# Patient Record
Sex: Male | Born: 1950
Health system: Southern US, Community
[De-identification: ages and names within clinical notes are randomized; demographics above are authoritative.]

## PROBLEM LIST (undated history)

## (undated) DIAGNOSIS — Z972 Presence of dental prosthetic device (complete) (partial): Secondary | ICD-10-CM

## (undated) DIAGNOSIS — G4730 Sleep apnea, unspecified: Secondary | ICD-10-CM

## (undated) DIAGNOSIS — E669 Obesity, unspecified: Secondary | ICD-10-CM

## (undated) DIAGNOSIS — M51369 Other intervertebral disc degeneration, lumbar region without mention of lumbar back pain or lower extremity pain: Secondary | ICD-10-CM

## (undated) DIAGNOSIS — R7301 Impaired fasting glucose: Secondary | ICD-10-CM

## (undated) DIAGNOSIS — E119 Type 2 diabetes mellitus without complications: Secondary | ICD-10-CM

## (undated) DIAGNOSIS — K08109 Complete loss of teeth, unspecified cause, unspecified class: Secondary | ICD-10-CM

## (undated) DIAGNOSIS — M5136 Other intervertebral disc degeneration, lumbar region: Secondary | ICD-10-CM

## (undated) DIAGNOSIS — N400 Enlarged prostate without lower urinary tract symptoms: Secondary | ICD-10-CM

## (undated) DIAGNOSIS — I10 Essential (primary) hypertension: Secondary | ICD-10-CM

## (undated) DIAGNOSIS — M1990 Unspecified osteoarthritis, unspecified site: Secondary | ICD-10-CM

## (undated) DIAGNOSIS — M5126 Other intervertebral disc displacement, lumbar region: Secondary | ICD-10-CM

## (undated) DIAGNOSIS — R972 Elevated prostate specific antigen [PSA]: Secondary | ICD-10-CM

## (undated) DIAGNOSIS — E785 Hyperlipidemia, unspecified: Secondary | ICD-10-CM

## (undated) HISTORY — PX: PROSTATE BIOPSY: SHX241

## (undated) HISTORY — DX: Complete loss of teeth, unspecified cause, unspecified class: Z97.2

## (undated) HISTORY — DX: Other intervertebral disc degeneration, lumbar region without mention of lumbar back pain or lower extremity pain: M51.369

## (undated) HISTORY — DX: Other intervertebral disc displacement, lumbar region: M51.26

## (undated) HISTORY — DX: Sleep apnea, unspecified: G47.30

## (undated) HISTORY — DX: Hyperlipidemia, unspecified: E78.5

## (undated) HISTORY — DX: Elevated prostate specific antigen (PSA): R97.20

## (undated) HISTORY — DX: Unspecified osteoarthritis, unspecified site: M19.90

## (undated) HISTORY — DX: Obesity, unspecified: E66.9

## (undated) HISTORY — DX: Benign prostatic hyperplasia without lower urinary tract symptoms: N40.0

## (undated) HISTORY — DX: Complete loss of teeth, unspecified cause, unspecified class: K08.109

## (undated) HISTORY — DX: Impaired fasting glucose: R73.01

## (undated) HISTORY — DX: Type 2 diabetes mellitus without complications: E11.9

## (undated) HISTORY — DX: Other intervertebral disc degeneration, lumbar region: M51.36

---

## 1993-03-05 HISTORY — PX: INGUINAL HERNIA REPAIR: SUR1180

## 2010-04-05 ENCOUNTER — Other Ambulatory Visit (HOSPITAL_COMMUNITY): Payer: Self-pay | Admitting: Chiropractic Medicine

## 2010-04-05 DIAGNOSIS — M545 Low back pain, unspecified: Secondary | ICD-10-CM

## 2010-04-13 ENCOUNTER — Ambulatory Visit (HOSPITAL_COMMUNITY)
Admission: RE | Admit: 2010-04-13 | Discharge: 2010-04-13 | Disposition: A | Discharge status: Home / Self Care | Payer: No Typology Code available for payment source | Source: Ambulatory Visit | Attending: Chiropractic Medicine | Admitting: Chiropractic Medicine

## 2010-04-13 ENCOUNTER — Other Ambulatory Visit (HOSPITAL_COMMUNITY): Payer: Self-pay | Admitting: Chiropractic Medicine

## 2010-04-13 DIAGNOSIS — M545 Low back pain, unspecified: Secondary | ICD-10-CM

## 2010-04-13 DIAGNOSIS — M48061 Spinal stenosis, lumbar region without neurogenic claudication: Secondary | ICD-10-CM | POA: Insufficient documentation

## 2010-04-13 DIAGNOSIS — M5137 Other intervertebral disc degeneration, lumbosacral region: Secondary | ICD-10-CM | POA: Insufficient documentation

## 2010-04-13 DIAGNOSIS — X58XXXA Exposure to other specified factors, initial encounter: Secondary | ICD-10-CM

## 2010-04-13 DIAGNOSIS — M51379 Other intervertebral disc degeneration, lumbosacral region without mention of lumbar back pain or lower extremity pain: Secondary | ICD-10-CM | POA: Insufficient documentation

## 2010-04-13 DIAGNOSIS — M519 Unspecified thoracic, thoracolumbar and lumbosacral intervertebral disc disorder: Secondary | ICD-10-CM | POA: Insufficient documentation

## 2010-04-13 DIAGNOSIS — Z01818 Encounter for other preprocedural examination: Secondary | ICD-10-CM | POA: Insufficient documentation

## 2010-04-13 DIAGNOSIS — M47817 Spondylosis without myelopathy or radiculopathy, lumbosacral region: Secondary | ICD-10-CM | POA: Insufficient documentation

## 2011-08-20 ENCOUNTER — Emergency Department (HOSPITAL_COMMUNITY)
Admission: EM | Admit: 2011-08-20 | Discharge: 2011-08-21 | Disposition: A | Discharge status: Home / Self Care | Payer: Self-pay | Attending: Emergency Medicine | Admitting: Emergency Medicine

## 2011-08-20 ENCOUNTER — Encounter (HOSPITAL_COMMUNITY): Payer: Self-pay | Admitting: Emergency Medicine

## 2011-08-20 DIAGNOSIS — N429 Disorder of prostate, unspecified: Secondary | ICD-10-CM | POA: Insufficient documentation

## 2011-08-20 DIAGNOSIS — Z79899 Other long term (current) drug therapy: Secondary | ICD-10-CM | POA: Insufficient documentation

## 2011-08-20 DIAGNOSIS — I10 Essential (primary) hypertension: Secondary | ICD-10-CM | POA: Insufficient documentation

## 2011-08-20 DIAGNOSIS — R509 Fever, unspecified: Secondary | ICD-10-CM | POA: Insufficient documentation

## 2011-08-20 DIAGNOSIS — R7989 Other specified abnormal findings of blood chemistry: Secondary | ICD-10-CM | POA: Insufficient documentation

## 2011-08-20 HISTORY — DX: Essential (primary) hypertension: I10

## 2011-08-20 NOTE — ED Notes (Signed)
Pt reports fevers for last 8 days; went to see MD b/c he has hx of prostate infection but MD cleared him; went to urgent care yesterday and had workup and everything clean except for liver enzymes elevated. Pt told that if he did not feel better today then to come to ED for further evaluation.

## 2011-08-21 LAB — CBC
HCT: 41.4 % (ref 39.0–52.0)
Hemoglobin: 14.3 g/dL (ref 13.0–17.0)
MCH: 29.6 pg (ref 26.0–34.0)
MCV: 85.7 fL (ref 78.0–100.0)
RBC: 4.83 MIL/uL (ref 4.22–5.81)

## 2011-08-21 LAB — DIFFERENTIAL
Eosinophils Absolute: 0 10*3/uL (ref 0.0–0.7)
Eosinophils Relative: 0 % (ref 0–5)
Lymphs Abs: 1.7 10*3/uL (ref 0.7–4.0)
Monocytes Relative: 8 % (ref 3–12)
Neutrophils Relative %: 59 % (ref 43–77)

## 2011-08-21 LAB — URINALYSIS, ROUTINE W REFLEX MICROSCOPIC
Ketones, ur: NEGATIVE mg/dL
Nitrite: NEGATIVE
Protein, ur: NEGATIVE mg/dL
Urobilinogen, UA: 1 mg/dL (ref 0.0–1.0)
pH: 6.5 (ref 5.0–8.0)

## 2011-08-21 LAB — COMPREHENSIVE METABOLIC PANEL
Alkaline Phosphatase: 145 U/L — ABNORMAL HIGH (ref 39–117)
BUN: 8 mg/dL (ref 6–23)
GFR calc Af Amer: >90 mL/min (ref >90)
Glucose, Bld: 115 mg/dL — ABNORMAL HIGH (ref 70–99)
Potassium: 4.4 mEq/L (ref 3.5–5.1)
Total Bilirubin: 0.5 mg/dL (ref 0.3–1.2)
Total Protein: 7.3 g/dL (ref 6.0–8.3)

## 2011-08-21 LAB — URINE CULTURE

## 2011-08-21 LAB — URINE MICROSCOPIC-ADD ON

## 2011-08-21 MED ORDER — DOXYCYCLINE HYCLATE 100 MG PO CAPS: 100.0000 mg | ORAL_CAPSULE | Freq: Two times a day (BID) | ORAL | Status: AC

## 2011-08-21 MED ORDER — DOXYCYCLINE HYCLATE 100 MG PO TABS
100.0000 mg | ORAL_TABLET | Freq: Once | ORAL | Status: AC
  Administered 2011-08-21: 100 mg via ORAL
  Filled 2011-08-21: qty 1

## 2011-08-21 NOTE — ED Provider Notes (Signed)
History     CSN: [id number]  Arrival date & time 08/20/11  4098   First MD Initiated Contact with Patient 08/20/11 2304      Chief Complaint  Patient presents with  . Fever    (Consider location/radiation/quality/duration/timing/severity/associated sxs/prior treatment) Patient is a 61 y.o. male presenting with fever.  Fever Primary symptoms of the febrile illness include fever.   61 year old male presents to emergency room complaining of 8 days of intermittent fevers. Patient reports fevers have gone to 101. When the Deaver is present he has headaches. Patient denies any other symptoms associated with the fevers. He denies any tick bites or other insect bites. He has had no rashes. He denies any nausea vomiting or diarrhea. No abdominal pain no cough. Today he has had some slight head congestion. Patient reports history of prostatitis in the past, but denies any problems with urination, or prostate type pain. No sick contacts, no travel, no camping, no prior history of similar fevers. patient denies any neck stiffness or pain. Patient was seen by his urologist on Friday, and had a reportedly negative urine. Patient was seen at urgent care on Saturday, and had negative chest x-ray and blood work negative other than elevated LFTs. He was told to stop taking Tylenol. Patient denies previous history of liver problems. No new medications Past Medical History  Diagnosis Date  . Hypertension   . Prostate disease     No past surgical history on file.  No family history on file.  History  Substance Use Topics  . Smoking status: Not on file  . Smokeless tobacco: Not on file  . Alcohol Use:       Review of Systems  Constitutional: Positive for fever.  All other systems reviewed and are negative.    Allergies  Morphine and related  Home Medications   Current Outpatient Rx  Name Route Sig Dispense Refill  . LISINOPRIL-HYDROCHLOROTHIAZIDE 20-12.5 MG PO TABS Oral Take 1 tablet by  mouth daily.    Marland Kitchen TAMSULOSIN HCL 0.4 MG PO CAPS Oral Take 0.4 mg by mouth.      BP 139/91  Pulse 65  Temp 98.3 F (36.8 C) (Oral)  Resp 18  SpO2 97%  Physical Exam  Nursing note and vitals reviewed. Constitutional: He is oriented to person, place, and time. He appears well-developed and well-nourished.  HENT:  Head: Normocephalic and atraumatic.  Right Ear: External ear normal.  Left Ear: External ear normal.  Nose: Nose normal.  Mouth/Throat: Oropharynx is clear and moist.  Eyes: Conjunctivae and EOM are normal. Pupils are equal, round, and reactive to light.  Neck: Normal range of motion. Neck supple. No JVD present. No tracheal deviation present. No thyromegaly present.  Cardiovascular: Normal rate, regular rhythm, normal heart sounds and intact distal pulses.  Exam reveals no gallop and no friction rub.   No murmur heard. Pulmonary/Chest: Effort normal and breath sounds normal. No stridor. No respiratory distress. He has no wheezes. He has no rales. He exhibits no tenderness.  Abdominal: Soft. Bowel sounds are normal. He exhibits no distension and no mass. There is no tenderness. There is no rebound and no guarding.  Genitourinary: Rectum normal and prostate normal.  Musculoskeletal: Normal range of motion. He exhibits no edema and no tenderness.  Lymphadenopathy:    He has no cervical adenopathy.  Neurological: He is oriented to person, place, and time. He has normal reflexes. No cranial nerve deficit. He exhibits normal muscle tone. Coordination normal.  Skin: Skin  is dry. No rash noted. No erythema. No pallor.  Psychiatric: He has a normal mood and affect. His behavior is normal. Judgment and thought content normal.    ED Course  Procedures (including critical care time)  Labs Reviewed  COMPREHENSIVE METABOLIC PANEL - Abnormal; Notable for the following:    Glucose, Bld 115 (*)     AST 119 (*)     ALT 124 (*)     Alkaline Phosphatase 145 (*)     All other components  within normal limits  URINALYSIS, ROUTINE W REFLEX MICROSCOPIC - Abnormal; Notable for the following:    Leukocytes, UA TRACE (*)     All other components within normal limits  CBC  DIFFERENTIAL  URINE MICROSCOPIC-ADD ON  CULTURE, BLOOD (ROUTINE X 2)  CULTURE, BLOOD (ROUTINE X 2)  URINE CULTURE  HEPATITIS PANEL, ACUTE  ROCKY MTN SPOTTED FVR AB, IGG-BLOOD  ROCKY MTN SPOTTED FVR AB, IGM-BLOOD  LYME DISEASE DNA BY PCR(BORRELIA BURG)   No results found.   1. Fever of unknown origin       MDM  61 year old with 8 days of fever and is otherwise unexplained. He does have elevated LFTs, has not remember any tick bites. Will draw titers for Lyme and Rocky spotted mountain fever, as well as hepatitis labs. Will start on doxycycline for possible tickborne illness. Will have him followup with [facility]3 days for recheck of labs and symptoms. Patient may need followup with infectious disease if fevers continue       Olivia Mackie, MD 08/21/11 [phone]

## 2011-08-21 NOTE — Discharge Instructions (Signed)
At this time it is unclear what is the cause of your fever. You had labs today checking for possible hepatitis and tickborne diseases. You also had blood cultures done. Please followup with your doctor in 2-3 days for followup of these labs. Continue taking ibuprofen or Aleve for fevers. Start doxycycline as prescribed. This antibiotic is helpful with tickborne diseases. Return to the emergency department for worsening condition or new concerning symptoms.  Fever of Unknown Origin Fever of "unknown origin" is a fever of at least 101 F (38.3 C) or greater, and that has gone on daily for three weeks. It is a fever which has a hidden cause. Fever is a higher-than-normal body temperature. Normal temperature is usually defined as 98.6 F or 37 C. Fever is a symptom, not a disease. A fever may mean that there is something else going on in the body that is causing it. CAUSES Fever can be caused by many conditions, including:   Infections.   Tissue injuries.   Medicines.   Different diseases.   Being in hot surroundings.   Tumors or cancers (this is a rare cause).  SYMPTOMS The signs and symptoms of a fever depend on the cause. At first, a fever can cause a chill. When the brain raises the body's "thermostat," the body responds by shivering to raise the temperature. Shivering produces heat in the body. Once the temperature goes up, the person often feels warm. When the fever goes away, the person may start to sweat. DIAGNOSIS  There can be many causes of fever. Sometimes, the reason can be very difficult to find. Your caregiver may have to do numerous tests to track down the reason. TREATMENT   Medication may be used to control fever.   Do not use aspirin because of the association with Reye's syndrome.   If an infection is suspected to be causing the fever and medications have been prescribed, take them as directed. Finish the full course of medications until they are gone.   Sponging or  bathing in lukewarm water can cool the skin and reduce body temperature. Ice water or alcohol sponge baths are not as effective as lukewarm water and should not be used.  HOME CARE  Continue to eat normally.   Drink enough fluids to keep urine clear or pale yellow.   Broths, decaffeinated tea, decaffeinated soft drinks, and oral rehydration solutions (ORS) can help replace fluids and electrolytes.   Keep all follow-up appointments as directed by your caregiver.   Weigh yourself once a day. Write down the weights and bring them to your follow-up appointments to review with your caregiver.  SEEK IMMEDIATE MEDICAL CARE IF:   You or your child is unable to keep fluids down.   Vomiting or diarrhea develop or are present and become persistent (continued).   There is excessive weakness, dizziness, fainting or extreme thirst.   You have a fever or persistent symptoms for more than 72 hours.   You have a fever and your symptoms suddenly get worse.  Document Released: 01/06/2004 Document Revised: 02/08/2011 Document Reviewed: 02/19/2005 [facility] Patient Information 2012 [place], [place].

## 2011-08-22 LAB — HEPATITIS PANEL, ACUTE
Hep A IgM: NEGATIVE
Hep B C IgM: NEGATIVE
Hepatitis B Surface Ag: NEGATIVE

## 2011-08-27 LAB — CULTURE, BLOOD (ROUTINE X 2)
Culture  Setup Time: 201306180343
Culture: NO GROWTH

## 2012-03-05 HISTORY — PX: PROSTATE BIOPSY: SHX241

## 2013-03-05 HISTORY — PX: COLONOSCOPY: SHX174

## 2013-10-16 ENCOUNTER — Encounter: Payer: Self-pay | Admitting: Gastroenterology

## 2013-12-11 ENCOUNTER — Ambulatory Visit (AMBULATORY_SURGERY_CENTER): Payer: BC Managed Care – PPO | Admitting: *Deleted

## 2013-12-11 VITALS — Ht 67.0 in | Wt 218.0 lb

## 2013-12-11 DIAGNOSIS — Z1211 Encounter for screening for malignant neoplasm of colon: Secondary | ICD-10-CM

## 2013-12-11 NOTE — Progress Notes (Signed)
No allergies to eggs or soy. No problems with anesthesia.  Pt given Emmi instructions for colonoscopy  No oxygen use  No diet drug use  

## 2013-12-15 ENCOUNTER — Encounter: Payer: Self-pay | Admitting: Internal Medicine

## 2013-12-15 ENCOUNTER — Ambulatory Visit (AMBULATORY_SURGERY_CENTER): Payer: BC Managed Care – PPO | Admitting: Internal Medicine

## 2013-12-15 VITALS — BP 126/71 | HR 53 | Temp 97.4°F | Resp 17 | Ht 67.0 in | Wt 218.0 lb

## 2013-12-15 DIAGNOSIS — Z1211 Encounter for screening for malignant neoplasm of colon: Secondary | ICD-10-CM

## 2013-12-15 MED ORDER — SODIUM CHLORIDE 0.9 % IV SOLN: 500.0000 mL | INTRAVENOUS | Status: DC

## 2013-12-15 NOTE — Progress Notes (Signed)
A/ox3 pleased with MAC, report to Wendy RN 

## 2013-12-15 NOTE — Op Note (Signed)
[facility] [address] [place], [id number]   COLONOSCOPY PROCEDURE REPORT  PATIENT: Luke Orr, Luke Orr  MR#: [id number] BIRTHDATE: [birth year] , 62  yrs. old GENDER: male ENDOSCOPIST: Iva Booparl E Shayda Kalka, MD, [facility] PROCEDURE DATE:  12/15/2013 PROCEDURE:   Colonoscopy, screening First Screening Colonoscopy - Avg.  risk and is 50 yrs.  old or older - No.  Prior Negative Screening - Now for repeat screening. 10 or more years since last screening  History of Adenoma - Now for follow-up colonoscopy & has been > or = to 3 yrs.  N/A  Polyps Removed Today? No.  Polyps Removed Today? No.  Recommend repeat exam, <10 yrs? Polyps Removed Today? No.  Recommend repeat exam, <10 yrs? No. ASA CLASS:   Class II INDICATIONS:average risk for colorectal cancer and last colonoscopy completed  11 years ago. MEDICATIONS: Propofol 200 mg IV and Monitored anesthesia care  DESCRIPTION OF PROCEDURE:   After the risks benefits and alternatives of the procedure were thoroughly explained, informed consent was obtained.  The digital rectal exam revealed no abnormalities of the rectum, revealed no prostatic nodules, and revealed the prostate was not enlarged.   The LB [id number] endoscope was introduced through the anus and advanced to the cecum, which was identified by both the appendix and ileocecal valve. No adverse events experienced.   The quality of the prep was excellent, using MiraLax  The instrument was then slowly withdrawn as the colon was fully examined.      COLON FINDINGS: There was mild diverticulosis noted in the sigmoid colon and ascending colon.   The examination was otherwise normal. Retroflexed views revealed no abnormalities. The time to cecum=3 minutes 33 seconds.  Withdrawal time=9 minutes 52 seconds.  The scope was withdrawn and the procedure completed. COMPLICATIONS: There were no immediate complications.  ENDOSCOPIC IMPRESSION: 1.   Mild diverticulosis was  noted in the sigmoid colon and ascending colon 2.   The examination was otherwise normal  RECOMMENDATIONS: Repeat colonoscopy 10 years.  eSigned:  Iva Booparl E Brynlee Pennywell, MD, [facility] 12/15/2013 2:31 PM   cc: The Patient and Gabriel Earingavis Cloward, MD

## 2013-12-15 NOTE — Patient Instructions (Addendum)
No polyps today!  You do have a condition called diverticulosis - common and not usually a problem. Please read the handout provided.  Next routine colonoscopy in 10 years - 2025  I appreciate the opportunity to care for you. Iva Booparl E. Gessner, MD, FACG  YOU HAD AN ENDOSCOPIC PROCEDURE TODAY AT THE [facility]: Refer to the procedure report that was given to you for any specific questions about what was found during the examination.  If the procedure report does not answer your questions, please call your gastroenterologist to clarify.  If you requested that your care partner not be given the details of your procedure findings, then the procedure report has been included in a sealed envelope for you to review at your convenience later.  YOU SHOULD EXPECT: Some feelings of bloating in the abdomen. Passage of more gas than usual.  Walking can help get rid of the air that was put into your GI tract during the procedure and reduce the bloating. If you had a lower endoscopy (such as a colonoscopy or flexible sigmoidoscopy) you may notice spotting of blood in your stool or on the toilet paper. If you underwent a bowel prep for your procedure, then you may not have a normal bowel movement for a few days.  DIET: Your first meal following the procedure should be a light meal and then it is ok to progress to your normal diet.  A half-sandwich or bowl of soup is an example of a good first meal.  Heavy or fried foods are harder to digest and may make you feel nauseous or bloated.  Likewise meals heavy in dairy and vegetables can cause extra gas to form and this can also increase the bloating.  Drink plenty of fluids but you should avoid alcoholic beverages for 24 hours.  ACTIVITY: Your care partner should take you home directly after the procedure.  You should plan to take it easy, moving slowly for the rest of the day.  You can resume normal activity the day after the procedure however you  should NOT DRIVE or use heavy machinery for 24 hours (because of the sedation medicines used during the test).    SYMPTOMS TO REPORT IMMEDIATELY: A gastroenterologist can be reached at any hour.  During normal business hours, 8:30 AM to 5:00 PM Monday through Friday, call [phone].  After hours and on weekends, please call the GI answering service at [phone] who will take a message and have the physician on call contact you.   Following lower endoscopy (colonoscopy or flexible sigmoidoscopy):  Excessive amounts of blood in the stool  Significant tenderness or worsening of abdominal pains  Swelling of the abdomen that is new, acute  Fever of 100F or higher  FOLLOW UP: If any biopsies were taken you will be contacted by phone or by letter within the next 1-3 weeks.  Call your gastroenterologist if you have not heard about the biopsies in 3 weeks.  Our staff will call the home number listed on your records the next business day following your procedure to check on you and address any questions or concerns that you may have at that time regarding the information given to you following your procedure. This is a courtesy call and so if there is no answer at the home number and we have not heard from you through the emergency physician on call, we will assume that you have returned to your regular daily activities without incident.  SIGNATURES/CONFIDENTIALITY: You and/or your care partner have signed paperwork which will be entered into your electronic medical record.  These signatures attest to the fact that that the information above on your After Visit Summary has been reviewed and is understood.  Full responsibility of the confidentiality of this discharge information lies with you and/or your care-partner.  Recommendations Next routine colonoscopy in 10 years. Diverticulosis handout provided to patient/caregiver.

## 2013-12-16 ENCOUNTER — Telehealth: Payer: Self-pay | Admitting: *Deleted

## 2013-12-16 NOTE — Telephone Encounter (Signed)
  Follow up Call-  Call back number 12/15/2013  Post procedure Call Back phone  # [phone]  Permission to leave phone message Yes     Patient questions:  Do you have a fever, pain , or abdominal swelling? No. Pain Score  0 *  Have you tolerated food without any problems? Yes.    Have you been able to return to your normal activities? Yes.    Do you have any questions about your discharge instructions: Diet   No. Medications  No. Follow up visit  No.  Do you have questions or concerns about your Care? No.  Actions: * If pain score is 4 or above: No action needed, pain <4.

## 2013-12-22 ENCOUNTER — Encounter: Payer: Self-pay | Admitting: Gastroenterology

## 2013-12-25 ENCOUNTER — Encounter: Payer: Self-pay | Admitting: Cardiovascular Disease

## 2013-12-25 ENCOUNTER — Ambulatory Visit (INDEPENDENT_AMBULATORY_CARE_PROVIDER_SITE_OTHER): Payer: BC Managed Care – PPO | Admitting: Cardiovascular Disease

## 2013-12-25 ENCOUNTER — Telehealth: Payer: Self-pay | Admitting: *Deleted

## 2013-12-25 ENCOUNTER — Encounter (HOSPITAL_COMMUNITY): Payer: Self-pay | Admitting: *Deleted

## 2013-12-25 VITALS — BP 141/87 | HR 57 | Ht 66.0 in | Wt 215.9 lb

## 2013-12-25 DIAGNOSIS — E785 Hyperlipidemia, unspecified: Secondary | ICD-10-CM | POA: Insufficient documentation

## 2013-12-25 DIAGNOSIS — I10 Essential (primary) hypertension: Secondary | ICD-10-CM | POA: Insufficient documentation

## 2013-12-25 DIAGNOSIS — M79604 Pain in right leg: Secondary | ICD-10-CM

## 2013-12-25 DIAGNOSIS — I739 Peripheral vascular disease, unspecified: Secondary | ICD-10-CM

## 2013-12-25 NOTE — Assessment & Plan Note (Signed)
On statin therapy followed by his PCP 

## 2013-12-25 NOTE — Assessment & Plan Note (Signed)
Controlled on current medications 

## 2013-12-25 NOTE — Patient Instructions (Signed)
  We will see you back in follow up only as needed.   Dr Berry has ordered: lower extremity arterial doppler- During this test, ultrasound is used to evaluate arterial blood flow in the legs. Allow approximately one hour for this exam.      

## 2013-12-25 NOTE — Assessment & Plan Note (Signed)
The patient is a 63 year old gentleman with positive risk factors referred for right lower extremity pain. The pain is primarily in his knee and below. It's worse when he's sitting and lying down, better when he ambulates. He does have intact pedal pulses. His primary care physician thought that he had atherosclerotic changes and he was referred here for further evaluation. Likely this appears arthritic, he does have positive risk factors for vascular disease. I'm going to get lower extremity arterial Doppler studies these are normal, I will see him back when necessary.

## 2013-12-25 NOTE — Progress Notes (Signed)
12/25/2013 Luke Orr   1950/03/14  [id number]  Primary Physician No PCP Per Patient Primary Cardiologist: Runell GessJonathan J. Kamrin Spath MD Roseanne RenoFACP,FACC,FAHA, FSCAI   HPI:  [name] is a delightful 63 year old moderately overweight married African American male father of 3, grandfather of 4 grandchildren [facility] [address] for evaluation of PAD. 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 His risk factors for heart disease include a 35 pack years of tobacco abuse having quit 10 years ago. History of hypertension, hyperlipidemia as well as family history of heart disease. His father died of a myocardial infarction at age 334 and he has a brother who had stents. He has never had a heart attack or stroke and specifically denies chest pain or shortness of breath. He does get pain in his right knee and pretibial area that improves with ambulation and worse when sitting or lying down. His referring physician thought that there was a vascular component.   Current Outpatient Prescriptions  Medication Sig Dispense Refill  . aspirin 81 MG chewable tablet Chew 81 mg by mouth.      Marland Kitchen. atorvastatin (LIPITOR) 40 MG tablet TAKE 1 TABLET (40 MG TOTAL) BY MOUTH DAILY.      Marland Kitchen. lisinopril-hydrochlorothiazide (PRINZIDE,ZESTORETIC) 20-12.5 MG per tablet Take 1 tablet by mouth daily.       No current facility-administered medications for this visit.    Allergies  Allergen Reactions  . Morphine And Related Shortness Of Breath    History   Social History  . Marital Status: Unknown    Spouse Name: N/A    Number of Children: N/A  . Years of Education: N/A   Occupational History  . Not on file.   Social History Main Topics  . Smoking status: Former Smoker    Quit date: 03/06/2003  . Smokeless tobacco: Never Used  . Alcohol Use: 4.2 oz/week    7 Glasses of wine per week  . Drug Use: No  . Sexual Activity: Not on file   Other Topics Concern  . Not on file   Social History Narrative   . No narrative on file     Review of Systems: General: negative for chills, fever, night sweats or weight changes.  Cardiovascular: negative for chest pain, dyspnea on exertion, edema, orthopnea, palpitations, paroxysmal nocturnal dyspnea or shortness of breath Dermatological: negative for rash Respiratory: negative for cough or wheezing Urologic: negative for hematuria Abdominal: negative for nausea, vomiting, diarrhea, bright red blood per rectum, melena, or hematemesis Neurologic: negative for visual changes, syncope, or dizziness All other systems reviewed and are otherwise negative except as noted above.    Blood pressure 141/87, pulse 57, height 5\' 6"  (1.676 m), weight 215 lb 14.4 oz (97.932 kg).  General appearance: alert and no distress Neck: no adenopathy, no carotid bruit, no JVD, supple, symmetrical, trachea midline and thyroid not enlarged, symmetric, no tenderness/mass/nodules Lungs: clear to auscultation bilaterally Heart: regular rate and rhythm, S1, S2 normal, no murmur, click, rub or gallop Extremities: extremities normal, atraumatic, no cyanosis or edema and 2+ pedal pulses bilaterally  EKG sinus bradycardia of 57 without ST or T wave changes  ASSESSMENT AND PLAN:   Essential hypertension Controlled on current medications  Hyperlipidemia On statin therapy followed by his PCP  Lower extremity pain, right The patient is a 63 year old gentleman with positive risk factors referred for right lower extremity pain. The pain is primarily in his knee and below. It's worse when he's  sitting and lying down, better when he ambulates. He does have intact pedal pulses. His primary care physician thought that he had atherosclerotic changes and he was referred here for further evaluation. Likely this appears arthritic, he does have positive risk factors for vascular disease. I'm going to get lower extremity arterial Doppler studies these are normal, I will see him back when  necessary.      Runell GessJonathan J. Noelani Harbach MD FACP,FACC,FAHA, [facility] 12/25/2013 9:18 AM

## 2014-01-05 ENCOUNTER — Ambulatory Visit (HOSPITAL_COMMUNITY)
Admission: RE | Admit: 2014-01-05 | Discharge: 2014-01-05 | Disposition: A | Discharge status: Home / Self Care | Payer: BC Managed Care – PPO | Source: Ambulatory Visit | Attending: Cardiovascular Disease | Admitting: Cardiovascular Disease

## 2014-01-05 DIAGNOSIS — I739 Peripheral vascular disease, unspecified: Secondary | ICD-10-CM

## 2014-01-05 NOTE — Progress Notes (Signed)
Lower Extremity Arterial Duplex Completed. °Brianna L Mazza,RVT °

## 2014-01-07 NOTE — Telephone Encounter (Signed)
No answer, message left to call with concerns

## 2014-01-14 ENCOUNTER — Encounter: Payer: Self-pay | Admitting: *Deleted

## 2014-09-13 ENCOUNTER — Encounter (HOSPITAL_COMMUNITY): Payer: Self-pay | Admitting: Nurse Practitioner

## 2014-09-13 ENCOUNTER — Emergency Department (HOSPITAL_COMMUNITY)
Admission: EM | Admit: 2014-09-13 | Discharge: 2014-09-13 | Disposition: A | Discharge status: Home / Self Care | Payer: 59 | Attending: Emergency Medicine | Admitting: Emergency Medicine

## 2014-09-13 DIAGNOSIS — I10 Essential (primary) hypertension: Secondary | ICD-10-CM | POA: Diagnosis not present

## 2014-09-13 DIAGNOSIS — E785 Hyperlipidemia, unspecified: Secondary | ICD-10-CM | POA: Insufficient documentation

## 2014-09-13 DIAGNOSIS — M1990 Unspecified osteoarthritis, unspecified site: Secondary | ICD-10-CM | POA: Insufficient documentation

## 2014-09-13 DIAGNOSIS — Z79899 Other long term (current) drug therapy: Secondary | ICD-10-CM | POA: Insufficient documentation

## 2014-09-13 DIAGNOSIS — Z7982 Long term (current) use of aspirin: Secondary | ICD-10-CM | POA: Insufficient documentation

## 2014-09-13 DIAGNOSIS — Z87891 Personal history of nicotine dependence: Secondary | ICD-10-CM | POA: Insufficient documentation

## 2014-09-13 NOTE — ED Notes (Signed)
Pt reports head congestion and not feeling well since Thursday, hes been checking his BP and its been elevated. He increased his BP medication to twice daily on his own but his symptoms persist. A&Ox4, rsep e/u. Denies cp/sob

## 2014-09-13 NOTE — ED Provider Notes (Signed)
CSN: [id number]     Arrival date & time 09/13/14  1742 History   First MD Initiated Contact with Patient 09/13/14 1937     Chief Complaint  Patient presents with  . Hypertension     (Consider location/radiation/quality/duration/timing/severity/associated sxs/prior Treatment) HPI Comments: Patient presents today with a chief complaint of hypertension.  He reports that he checked his blood pressure four days ago and it was 190/110.  He states that he has continued to check it daily since that time and it has been around 190/110 each time. He has a history of HTN and is currently on Lisinopril 20 mg-HCTZ 12.5 mg daily.  He states that over the past 3 days he has been taking the medication in the morning and in the evening.  He reports his last dose of blood pressure medications was this morning.  He reports some associated nasal congestion, post nasal drip, and sinus pressure over the past 3 days, but states that he has not been taking any decongestants or other medications.  He denies headache, vision changes, numbness, tingling, chest pain, SOB, nausea, vomiting, focal weakness, ataxia, difficulty speaking, difficulty swallowing, or facial droop.  He reports that he has an appointment with PCP in two days.  Patient is a 64 y.o. male presenting with hypertension. The history is provided by the patient.  Hypertension    Past Medical History  Diagnosis Date  . Hypertension   . Prostate disease   . Hyperlipidemia   . Arthritis   . Atherosclerosis of autologous vein bypass graft of right lower extremity with rest pain    Past Surgical History  Procedure Laterality Date  . Inguinal hernia repair Right 1995  . Colonoscopy     Family History  Problem Relation Age of Onset  . Colon cancer Neg Hx    History  Substance Use Topics  . Smoking status: Former Smoker    Quit date: 03/06/2003  . Smokeless tobacco: Never Used  . Alcohol Use: 4.2 oz/week    7 Glasses of wine per week    Review  of Systems  All other systems reviewed and are negative.     Allergies  Morphine and related  Home Medications   Prior to Admission medications   Medication Sig Start Date End Date Taking? Authorizing Provider  aspirin 81 MG chewable tablet Chew 81 mg by mouth.    Historical Provider, MD  atorvastatin (LIPITOR) 40 MG tablet TAKE 1 TABLET (40 MG TOTAL) BY MOUTH DAILY. 10/12/13   Historical Provider, MD  lisinopril-hydrochlorothiazide (PRINZIDE,ZESTORETIC) 20-12.5 MG per tablet Take 1 tablet by mouth daily.    Historical Provider, MD   BP 148/93 mmHg  Pulse 66  Temp(Src) 98.8 F (37.1 C) (Oral)  Resp 16  Ht  (1.702 m)  Wt 223 lb 8 oz (101.379 kg)  BMI 35.00 kg/m2  SpO2 98% Physical Exam  Constitutional: He appears well-developed and well-nourished.  HENT:  Head: Normocephalic and atraumatic.  Mouth/Throat: Oropharynx is clear and moist.  Eyes: EOM are normal. Pupils are equal, round, and reactive to light.  Neck: Normal range of motion. Neck supple.  Cardiovascular: Normal rate, regular rhythm and normal heart sounds.   Pulmonary/Chest: Effort normal and breath sounds normal.  Musculoskeletal: Normal range of motion.  Neurological: He is alert. He has normal strength. No cranial nerve deficit or sensory deficit. Coordination and gait normal.  Skin: Skin is warm and dry.  Psychiatric: He has a normal mood and affect.  Nursing note and  vitals reviewed.   ED Course  Procedures (including critical care time) Labs Review Labs Reviewed - No data to display  Imaging Review No results found.   EKG Interpretation None     Today's Vitals   09/13/14 1803 09/13/14 1806 09/13/14 2007 09/13/14 2010  BP:  148/93 132/76 132/76  Pulse:  66 57 56  Temp:  98.8 F (37.1 C)    TempSrc:  Oral    Resp:  16  22  Height: 5\' 7"  (1.702 m)     Weight: 223 lb 8 oz (101.379 kg)     SpO2:  98% 97% 98%  PainSc:  0-No pain      MDM   Final diagnoses:  None   Patient presents  today with a chief complaint of hypertension.  No signs or symptoms of hypertensive emergency/urgency.  No chest pain.  Normal neurological exam.  Blood pressure 148/93 upon arrival in the ED, which improved to 132/76 without intervention.  Patient stable for discharge.  He reports that he has a follow up appointment with PCP in 2 days.  Return precautions given.    Santiago GladHeather Kahli Mayon, PA-C 09/13/14 [id number]  Gerhard Munchobert Lockwood, MD 09/14/14 Moses Manners0025

## 2014-09-13 NOTE — Discharge Instructions (Signed)
Follow up with your Primary Care Physician on Wednesday as scheduled.  Keep a log of your blood pressures to show them.   Hypertension Hypertension, commonly called high blood pressure, is when the force of blood pumping through your arteries is too strong. Your arteries are the blood vessels that carry blood from your heart throughout your body. A blood pressure reading consists of a higher number over a lower number, such as 110/72. The higher number (systolic) is the pressure inside your arteries when your heart pumps. The lower number (diastolic) is the pressure inside your arteries when your heart relaxes. Ideally you want your blood pressure below 120/80. Hypertension forces your heart to work harder to pump blood. Your arteries may become narrow or stiff. Having hypertension puts you at risk for heart disease, stroke, and other problems.  RISK FACTORS Some risk factors for high blood pressure are controllable. Others are not.  Risk factors you cannot control include:   Race. You may be at higher risk if you are African American.  Age. Risk increases with age.  Gender. Men are at higher risk than women before age 62 years. After age 62, women are at higher risk than men. Risk factors you can control include:  Not getting enough exercise or physical activity.  Being overweight.  Getting too much fat, sugar, calories, or salt in your diet.  Drinking too much alcohol. SIGNS AND SYMPTOMS Hypertension does not usually cause signs or symptoms. Extremely high blood pressure (hypertensive crisis) may cause headache, anxiety, shortness of breath, and nosebleed. DIAGNOSIS  To check if you have hypertension, your health care provider will measure your blood pressure while you are seated, with your arm held at the level of your heart. It should be measured at least twice using the same arm. Certain conditions can cause a difference in blood pressure between your right and left arms. A blood  pressure reading that is higher than normal on one occasion does not mean that you need treatment. If one blood pressure reading is high, ask your health care provider about having it checked again. TREATMENT  Treating high blood pressure includes making lifestyle changes and possibly taking medicine. Living a healthy lifestyle can help lower high blood pressure. You may need to change some of your habits. Lifestyle changes may include:  Following the DASH diet. This diet is high in fruits, vegetables, and whole grains. It is low in salt, red meat, and added sugars.  Getting at least 2 hours of brisk physical activity every week.  Losing weight if necessary.  Not smoking.  Limiting alcoholic beverages.  Learning ways to reduce stress. If lifestyle changes are not enough to get your blood pressure under control, your health care provider may prescribe medicine. You may need to take more than one. Work closely with your health care provider to understand the risks and benefits. HOME CARE INSTRUCTIONS  Have your blood pressure rechecked as directed by your health care provider.   Take medicines only as directed by your health care provider. Follow the directions carefully. Blood pressure medicines must be taken as prescribed. The medicine does not work as well when you skip doses. Skipping doses also puts you at risk for problems.   Do not smoke.   Monitor your blood pressure at home as directed by your health care provider. SEEK MEDICAL CARE IF:   You think you are having a reaction to medicines taken.  You have recurrent headaches or feel dizzy.  You have  swelling in your ankles.  You have trouble with your vision. SEEK IMMEDIATE MEDICAL CARE IF:  You develop a severe headache or confusion.  You have unusual weakness, numbness, or feel faint.  You have severe chest or abdominal pain.  You vomit repeatedly.  You have trouble breathing. MAKE SURE YOU:   Understand  these instructions.  Will watch your condition.  Will get help right away if you are not doing well or get worse. Document Released: 02/19/2005 Document Revised: 07/06/2013 Document Reviewed: 12/12/2012 [facility] Patient Information 2015 [place], [place]. This information is not intended to replace advice given to you by your health care provider. Make sure you discuss any questions you have with your health care provider.  DASH Eating Plan DASH stands for "Dietary Approaches to Stop Hypertension." The DASH eating plan is a healthy eating plan that has been shown to reduce high blood pressure (hypertension). Additional health benefits may include reducing the risk of type 2 diabetes mellitus, heart disease, and stroke. The DASH eating plan may also help with weight loss. WHAT DO I NEED TO KNOW ABOUT THE DASH EATING PLAN? For the DASH eating plan, you will follow these general guidelines:  Choose foods with a percent daily value for sodium of less than 5% (as listed on the food label).  Use salt-free seasonings or herbs instead of table salt or sea salt.  Check with your health care provider or pharmacist before using salt substitutes.  Eat lower-sodium products, often labeled as "lower sodium" or "no salt added."  Eat fresh foods.  Eat more vegetables, fruits, and low-fat dairy products.  Choose whole grains. Look for the word "whole" as the first word in the ingredient list.  Choose fish and skinless chicken or [place] more often than red meat. Limit fish, poultry, and meat to 6 oz (170 g) each day.  Limit sweets, desserts, sugars, and sugary drinks.  Choose heart-healthy fats.  Limit cheese to 1 oz (28 g) per day.  Eat more home-cooked food and less restaurant, buffet, and fast food.  Limit fried foods.  Cook foods using methods other than frying.  Limit canned vegetables. If you do use them, rinse them well to decrease the sodium.  When eating at a restaurant, ask that your  food be prepared with less salt, or no salt if possible. WHAT FOODS CAN I EAT? Seek help from a dietitian for individual calorie needs. Grains Whole grain or whole wheat bread. Brown rice. Whole grain or whole wheat pasta. Quinoa, bulgur, and whole grain cereals. Low-sodium cereals. Corn or whole wheat flour tortillas. Whole grain cornbread. Whole grain crackers. Low-sodium crackers. Vegetables Fresh or frozen vegetables (raw, steamed, roasted, or grilled). Low-sodium or reduced-sodium tomato and vegetable juices. Low-sodium or reduced-sodium tomato sauce and paste. Low-sodium or reduced-sodium canned vegetables.  Fruits All fresh, canned (in natural juice), or frozen fruits. Meat and Other Protein Products Ground beef (85% or leaner), grass-fed beef, or beef trimmed of fat. Skinless chicken or [place]. Ground chicken or [place]. Pork trimmed of fat. All fish and seafood. Eggs. Dried beans, peas, or lentils. Unsalted nuts and seeds. Unsalted canned beans. Dairy Low-fat dairy products, such as skim or 1% milk, 2% or reduced-fat cheeses, low-fat ricotta or cottage cheese, or plain low-fat yogurt. Low-sodium or reduced-sodium cheeses. Fats and Oils Tub margarines without trans fats. Light or reduced-fat mayonnaise and salad dressings (reduced sodium). Avocado. Safflower, olive, or canola oils. Natural peanut or almond butter. Other Unsalted popcorn and pretzels. The items listed  above may not be a complete list of recommended foods or beverages. Contact your dietitian for more options. WHAT FOODS ARE NOT RECOMMENDED? Grains White bread. White pasta. White rice. Refined cornbread. Bagels and croissants. Crackers that contain trans fat. Vegetables Creamed or fried vegetables. Vegetables in a cheese sauce. Regular canned vegetables. Regular canned tomato sauce and paste. Regular tomato and vegetable juices. Fruits Dried fruits. Canned fruit in light or heavy syrup. Fruit juice. Meat and Other  Protein Products Fatty cuts of meat. Ribs, chicken wings, bacon, sausage, bologna, salami, chitterlings, fatback, hot dogs, bratwurst, and packaged luncheon meats. Salted nuts and seeds. Canned beans with salt. Dairy Whole or 2% milk, cream, half-and-half, and cream cheese. Whole-fat or sweetened yogurt. Full-fat cheeses or blue cheese. Nondairy creamers and whipped toppings. Processed cheese, cheese spreads, or cheese curds. Condiments Onion and garlic salt, seasoned salt, table salt, and sea salt. Canned and packaged gravies. Worcestershire sauce. Tartar sauce. Barbecue sauce. Teriyaki sauce. Soy sauce, including reduced sodium. Steak sauce. Fish sauce. Oyster sauce. Cocktail sauce. Horseradish. Ketchup and mustard. Meat flavorings and tenderizers. Bouillon cubes. Hot sauce. Tabasco sauce. Marinades. Taco seasonings. Relishes. Fats and Oils Butter, stick margarine, lard, shortening, ghee, and bacon fat. Coconut, palm kernel, or palm oils. Regular salad dressings. Other Pickles and olives. Salted popcorn and pretzels. The items listed above may not be a complete list of foods and beverages to avoid. Contact your dietitian for more information. WHERE CAN I FIND MORE INFORMATION? National Heart, Lung, and Blood Institute: [employer] Document Released: 02/08/2011 Document Revised: 07/06/2013 Document Reviewed: 12/24/2012 [facility] Patient Information 2015 [place], [place]. This information is not intended to replace advice given to you by your health care provider. Make sure you discuss any questions you have with your health care provider.

## 2014-09-15 ENCOUNTER — Telehealth: Payer: Self-pay | Admitting: Medical

## 2014-09-15 ENCOUNTER — Encounter: Payer: Self-pay | Admitting: Medical

## 2014-09-15 ENCOUNTER — Ambulatory Visit (INDEPENDENT_AMBULATORY_CARE_PROVIDER_SITE_OTHER): Payer: 59 | Admitting: Medical

## 2014-09-15 VITALS — BP 120/80 | HR 66 | Temp 98.0°F | Resp 14 | Ht 67.5 in | Wt 221.0 lb

## 2014-09-15 DIAGNOSIS — E785 Hyperlipidemia, unspecified: Secondary | ICD-10-CM

## 2014-09-15 DIAGNOSIS — R220 Localized swelling, mass and lump, head: Secondary | ICD-10-CM | POA: Diagnosis not present

## 2014-09-15 DIAGNOSIS — J3489 Other specified disorders of nose and nasal sinuses: Secondary | ICD-10-CM | POA: Insufficient documentation

## 2014-09-15 DIAGNOSIS — Z0000 Encounter for general adult medical examination without abnormal findings: Secondary | ICD-10-CM

## 2014-09-15 DIAGNOSIS — N400 Enlarged prostate without lower urinary tract symptoms: Secondary | ICD-10-CM

## 2014-09-15 DIAGNOSIS — E669 Obesity, unspecified: Secondary | ICD-10-CM | POA: Insufficient documentation

## 2014-09-15 DIAGNOSIS — K4090 Unilateral inguinal hernia, without obstruction or gangrene, not specified as recurrent: Secondary | ICD-10-CM | POA: Diagnosis not present

## 2014-09-15 DIAGNOSIS — I10 Essential (primary) hypertension: Secondary | ICD-10-CM | POA: Diagnosis not present

## 2014-09-15 DIAGNOSIS — Z23 Encounter for immunization: Secondary | ICD-10-CM | POA: Diagnosis not present

## 2014-09-15 LAB — COMPREHENSIVE METABOLIC PANEL
ALBUMIN: 4.3 g/dL (ref 3.5–5.2)
ALT: 20 U/L (ref 0–53)
AST: 21 U/L (ref 0–37)
Alkaline Phosphatase: 76 U/L (ref 39–117)
BILIRUBIN TOTAL: 0.8 mg/dL (ref 0.2–1.2)
BUN: 14 mg/dL (ref 6–23)
CHLORIDE: 99 meq/L (ref 96–112)
CO2: 29 mEq/L (ref 19–32)
CREATININE: 0.84 mg/dL (ref 0.50–1.35)
Calcium: 9.6 mg/dL (ref 8.4–10.5)
GLUCOSE: 105 mg/dL — AB (ref 70–99)
Potassium: 3.8 mEq/L (ref 3.5–5.3)
Sodium: 140 mEq/L (ref 135–145)
Total Protein: 6.9 g/dL (ref 6.0–8.3)

## 2014-09-15 LAB — POCT URINALYSIS DIPSTICK
Bilirubin, UA: NEGATIVE
Glucose, UA: NEGATIVE
Ketones, UA: NEGATIVE
LEUKOCYTES UA: NEGATIVE
NITRITE UA: NEGATIVE
Protein, UA: NEGATIVE
RBC UA: NEGATIVE
SPEC GRAV UA: 1.02
UROBILINOGEN UA: NEGATIVE
pH, UA: 6.5

## 2014-09-15 LAB — HEMOGLOBIN A1C
Hgb A1c MFr Bld: 6.3 % — ABNORMAL HIGH (ref <5.7)
Mean Plasma Glucose: 134 mg/dL — ABNORMAL HIGH (ref <117)

## 2014-09-15 LAB — CBC
HCT: 45.9 % (ref 39.0–52.0)
HEMOGLOBIN: 15.6 g/dL (ref 13.0–17.0)
MCH: 29.8 pg (ref 26.0–34.0)
MCHC: 34 g/dL (ref 30.0–36.0)
MCV: 87.6 fL (ref 78.0–100.0)
MPV: 10 fL (ref 8.6–12.4)
PLATELETS: 276 10*3/uL (ref 150–400)
RBC: 5.24 MIL/uL (ref 4.22–5.81)
RDW: 13.4 % (ref 11.5–15.5)
WBC: 6.6 10*3/uL (ref 4.0–10.5)

## 2014-09-15 LAB — LIPID PANEL
CHOLESTEROL: 141 mg/dL (ref 0–200)
HDL: 47 mg/dL (ref >=40)
LDL Cholesterol: 73 mg/dL (ref 0–99)
Total CHOL/HDL Ratio: 3 Ratio
Triglycerides: 107 mg/dL (ref <150)
VLDL: 21 mg/dL (ref 0–40)

## 2014-09-15 NOTE — Progress Notes (Signed)
Subjective:   HPI  Luke Orr is a 64 y.o. male who presents for a complete physical.  New patient today, accompanied by wife today.  Was seeing [facility] before.     Medical care team includes:  Dr. Annabell Howells, Urology   Preventative care: Last ophthalmology visit:N/A Last dental visit:N/A- DENTURES Last colonoscopy:2015 Last prostate exam:  Last EKG:03/2012 Last labs:NEW PATIENT  Prior vaccinations: TD or Tdap:N/A Influenza:NEVER Pneumococcal:N/A Shingles/Zostavax:N/A  Concerns: BP, lipids, recent ED visit for BP, been doubling up on BP medication, compliant with aspirin and statin  Reviewed their medical, surgical, family, social, medication, and allergy history and updated chart as appropriate.  Past Medical History  Diagnosis Date  . Hypertension   . Hyperlipidemia   . BPH (benign prostatic hypertrophy)   . Arthritis     right knee  . Obesity   . Full dentures     Past Surgical History  Procedure Laterality Date  . Inguinal hernia repair Right 1995  . Colonoscopy  2015    repeat 2025; Dr. Concha Se  . Prostate biopsy  2014    x 2; Dr. Annabell Howells    History   Social History  . Marital Status: Unknown    Spouse Name: N/A  . Number of Children: N/A  . Years of Education: N/A   Occupational History  . Not on file.   Social History Main Topics  . Smoking status: Former Smoker -- 1.00 packs/day for 5 years    Quit date: 03/06/2003  . Smokeless tobacco: Never Used  . Alcohol Use: 8.4 oz/week    7 Glasses of wine, 7 Cans of beer per week  . Drug Use: No  . Sexual Activity: Not on file   Other Topics Concern  . 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 Colon cancer Neg Hx   . Stroke Neg Hx   . Heart disease Mother   . Diabetes Mother   . Hypertension Mother   . Heart disease Father   .  Hypertension Sister   . Diabetes Sister   . Hypertension Brother   . Cancer Brother 50    prostate  . Hypertension Sister   . Diabetes Sister   . Hypertension Sister   . Diabetes Sister   . Hypertension Sister   . Hypertension Sister   . Hypertension Brother   . Diabetes Brother      Current outpatient prescriptions:  .  aspirin 81 MG chewable tablet, Chew 81 mg by mouth., Disp: , Rfl:  .  atorvastatin (LIPITOR) 40 MG tablet, TAKE 1 TABLET (40 MG TOTAL) BY MOUTH DAILY., Disp: , Rfl:  .  lisinopril-hydrochlorothiazide (PRINZIDE,ZESTORETIC) 20-12.5 MG per tablet, Take 1 tablet by mouth daily. Taking 1 tablet BID, Disp: , Rfl:   Allergies  Allergen Reactions  . Morphine And Related Shortness Of Breath       Review of Systems Constitutional: -fever, -chills, -sweats, -unexpected weight change, -decreased appetite, -fatigue Allergy: -sneezing, -itching, -congestion Dermatology: -changing moles, --rash, -lumps ENT: -runny nose, -ear pain, -sore throat, -hoarseness, -sinus pain, -teeth pain, - ringing in ears, -hearing loss, -nosebleeds Cardiology: -chest pain, -palpitations, -swelling, -difficulty breathing when lying flat, -waking up short of breath Respiratory: -cough, -shortness of breath, -difficulty breathing with exercise or exertion, -wheezing, -coughing up blood Gastroenterology: -  abdominal pain, -nausea, -vomiting, -diarrhea, -constipation, -blood in stool, -changes in bowel movement, -difficulty swallowing or eating Hematology: -bleeding, -bruising  Musculoskeletal: -joint aches, -muscle aches, -joint swelling, -back pain, -neck pain, -cramping, -changes in gait Ophthalmology: denies vision changes, eye redness, itching, discharge Urology: -burning with urination, -difficulty urinating, -blood in urine, -urinary frequency, -urgency, -incontinence Neurology: -headache, -weakness, -tingling, -numbness, -memory loss, -falls, -dizziness Psychology: -depressed mood,  -agitation, -sleep problems     Objective:   Physical Exam  BP 120/80 mmHg  Pulse 66  Temp(Src) 98 F (36.7 C) (Oral)  Resp 14  Ht 5' 7.5" (1.715 m)  Wt 221 lb (100.245 kg)  BMI 34.08 kg/m2  General appearance: alert, no distress, WD/WN, AA male Skin: scattered macules, no specific worrisome lesions HEENT: normocephalic, conjunctiva/corneas normal, sclerae anicteric, PERRLA, EOMi, nares patent, there is a raised 3mm round papule of left inferolateral nare with horn like lesion coming out of this, no discharge or erythema, pharynx normal Oral cavity: MMM, tongue normal, teeth - dentures upper and lower Neck: supple, no lymphadenopathy, no thyromegaly, no masses, normal ROM, no bruits Chest: non tender, normal shape and expansion Heart: RRR, normal S1, S2, no murmurs Lungs: CTA bilaterally, no wheezes, rhonchi, or rales Abdomen: +bs, soft, non tender, non distended, no masses, no hepatomegaly, no splenomegaly, no bruits Back: non tender, normal ROM, no scoliosis Musculoskeletal: upper extremities non tender, no obvious deformity, normal ROM throughout, lower extremities non tender, no obvious deformity, normal ROM throughout Extremities: no edema, no cyanosis, no clubbing Pulses: 2+ symmetric, upper and lower extremities, normal cap refill Neurological: alert, oriented x 3, CN2-12 intact, strength normal upper extremities and lower extremities, sensation normal throughout, DTRs 2+ throughout, no cerebellar signs, gait normal Psychiatric: normal affect, behavior normal, pleasant  GU: normal male external genitalia, uncircumcised, right hernia surgical scar, moderate sized reducible left inguinal hernia, nontender, no masses, no lymphadenopathy Rectal: anus normal tone, prostate with obvious moderate enlargement, no specific nodule   Assessment and Plan :    Encounter Diagnoses  Name Primary?  . Encounter for health maintenance examination in adult Yes  . Essential hypertension    . Hyperlipidemia   . Obesity   . BPH (benign prostatic hypertrophy)   . Inguinal hernia, left   . Nasal cavity mass   . Need for Tdap vaccination     Physical exam - discussed healthy lifestyle, diet, exercise, preventative care, vaccinations, and addressed their concerns.   See your eye doctor yearly for routine vision care. See your dentist yearly for routine dental care including hygiene visits twice yearly. HTN - pending labs, c/t Lisinopril HCT but add Amlodipine hyperlipidemia - c/t statin, labs today Obesity - need to start exercising, work on lifestyle changes, weight loss BPH - labs today, no major symptom currently Inguinal hernia - advised referral for repair.  He will consider and let us know Nasal cavity mass - referral to ENT Counseled on the Tdap (tetanus, diptheria, and acellular pertussis) vaccine.  Vaccine information sheet given. Tdap vaccine given after consent obtained. Advised he check insurance about shingles vaccine Return next month for flu vaccine.  Follow-up pending labs

## 2014-09-15 NOTE — Telephone Encounter (Signed)
Refer to Dr. Suszanne Connerseoh for nasal mass Max Sane/ENT

## 2014-09-15 NOTE — Telephone Encounter (Signed)
Appointment is on 10/04/14 @ 200 pm I mailed the patient a letter with all appointment details. Online [employer] referral was done and fax over

## 2014-09-15 NOTE — Telephone Encounter (Signed)
Dr. Janeece RiggersSu Philomena DohenyWooi Teoh, MD    Otolaryngologist  Address: [address], [place], [place] [address]  Phone: [phone]

## 2014-09-16 ENCOUNTER — Other Ambulatory Visit: Payer: Self-pay | Admitting: Medical

## 2014-09-16 LAB — PSA: PSA: 10.93 ng/mL — AB (ref <=4.00)

## 2014-09-16 MED ORDER — ASPIRIN EC 81 MG PO TBEC: 81.0000 mg | DELAYED_RELEASE_TABLET | Freq: Every day | ORAL | Status: DC

## 2014-09-16 MED ORDER — AMLODIPINE BESYLATE 10 MG PO TABS: 10.0000 mg | ORAL_TABLET | Freq: Every day | ORAL | Status: DC

## 2014-09-16 MED ORDER — ATORVASTATIN CALCIUM 40 MG PO TABS: 40.0000 mg | ORAL_TABLET | Freq: Every day | ORAL | Status: DC

## 2014-09-16 MED ORDER — LISINOPRIL-HYDROCHLOROTHIAZIDE 20-12.5 MG PO TABS: 1.0000 | ORAL_TABLET | Freq: Every day | ORAL | Status: DC

## 2015-03-24 ENCOUNTER — Encounter: Payer: Self-pay | Admitting: Medical

## 2015-03-24 ENCOUNTER — Telehealth: Payer: Self-pay | Admitting: Medical

## 2015-03-24 ENCOUNTER — Ambulatory Visit (INDEPENDENT_AMBULATORY_CARE_PROVIDER_SITE_OTHER): Payer: BLUE CROSS/BLUE SHIELD | Admitting: Medical

## 2015-03-24 VITALS — BP 142/90 | HR 54 | Wt 225.0 lb

## 2015-03-24 DIAGNOSIS — M20001 Unspecified deformity of right finger(s): Secondary | ICD-10-CM

## 2015-03-24 DIAGNOSIS — R972 Elevated prostate specific antigen [PSA]: Secondary | ICD-10-CM | POA: Diagnosis not present

## 2015-03-24 DIAGNOSIS — R143 Flatulence: Secondary | ICD-10-CM | POA: Diagnosis not present

## 2015-03-24 DIAGNOSIS — R140 Abdominal distension (gaseous): Secondary | ICD-10-CM | POA: Diagnosis not present

## 2015-03-24 DIAGNOSIS — M545 Low back pain: Secondary | ICD-10-CM

## 2015-03-24 DIAGNOSIS — S6981XD Other specified injuries of right wrist, hand and finger(s), subsequent encounter: Secondary | ICD-10-CM

## 2015-03-24 DIAGNOSIS — M20009 Unspecified deformity of unspecified finger(s): Secondary | ICD-10-CM | POA: Insufficient documentation

## 2015-03-24 LAB — POCT URINALYSIS DIPSTICK
Bilirubin, UA: NEGATIVE
Blood, UA: NEGATIVE
GLUCOSE UA: NEGATIVE
Ketones, UA: NEGATIVE
LEUKOCYTES UA: NEGATIVE
Nitrite, UA: NEGATIVE
PROTEIN UA: NEGATIVE
Spec Grav, UA: 1.02
UROBILINOGEN UA: NEGATIVE
pH, UA: 6.5

## 2015-03-24 NOTE — Telephone Encounter (Signed)
Refer to Urology for elevated PSA  Refer for finger.  Call as I'm not sure who should see him, dermatology vs hand specialist.  Probably hand specialist. 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  Having pain at the nail bed, with obvious nail deformity, for months.

## 2015-03-24 NOTE — Telephone Encounter (Signed)
Pt is aware of appt with [facility]

## 2015-03-24 NOTE — Telephone Encounter (Signed)
Referrals made and appt is at [facility] for 03/30/15 at 9am with dr Elenor Quinones pending the [facility] appt.

## 2015-03-24 NOTE — Progress Notes (Signed)
Subjective: Chief Complaint  Patient presents with  . lower bck pain.    bad gas for about a month. no bloating in stomach and been having bowel movements. rt pointer finger is bothering him    Here for multiple concerns.   Having lots of gas for a month, bloating, in general has daily BM, no dry hard stool, no blood in stool.  Using some Rolaids, Tums.  Denies hx/o bowel issues.   Eats a variety of foods.     Has pain at distal right index finger for months.  Stays tender and there is a bump or swollen area.   No drainage, no redness, no warmth  Has low back, thinks he pulled a muscle.  Was lifting a car cylinder the other day, weighed about 60 lb.   Has right low back pain without leg pain or paraesthesias, no incontinence,no fever, no urinary issues.   Never saw urology back in the summer when we referred for elevated PSA.   Past Medical History  Diagnosis Date  . Hypertension   . Hyperlipidemia   . BPH (benign prostatic hypertrophy)   . Arthritis     right knee  . Obesity   . Full dentures   . Bulging lumbar disc     self reported   ROS as in subjective   Objective: BP 142/90 mmHg  Pulse 54  Wt 225 lb (102.059 kg)  General appearance: alert, no distress, WD/WN Oral cavity: MMM, no lesions Neck: supple, no lymphadenopathy, no thyromegaly, no masses Heart: RRR, normal S1, S2, no murmurs Lungs: CTA bilaterally, no wheezes, rhonchi, or rales Abdomen: +increased bs, soft, non tender, non distended, no masses, no hepatomegaly, no splenomegaly Back: mild right paraspinal lumbar tenderness, no deformity, normal ROM with only limited pain MKS: legs and arms nontender, normal ROM Pulses: 2+ symmetric, upper and lower extremities, normal cap refill Medial side of  Right 2nd finger/index finger nail with rough groove from prior trauma, raised tender area at proximal nail bed without erythema warmth or pus, normal ROM   Assessment: Encounter Diagnoses  Name Primary?  . Low  back pain, unspecified back pain laterality, with sciatica presence unspecified Yes  . Abdominal bloating   . Flatulence   . Finger deformity, acquired, right   . Elevated PSA     Plan: Low back strain - discussed supportive care, NSAID's OTC, relative rest, f/u if worsening Abdominal bloating, flatulence - discussed possible causes, avoiding trigger foods, begin Nexium and IBgard samples x 2 weeks.  If not resolved within this time frame then recheck Finger deformity - referral to hand specialist Elevated PSA - referral to Urology as he never went back in the summer   Recommendations:  Being Nexium samples once daily in the morning until you run out  Begin IBgard samples, 1 twice daily until you run out  Avoid foods that make you gassy such as fried foods, beans, onions, heavy portions  If not improving within 2-3 weeks, then recheck  For back strain, avoid heavy lifting for the next week  You can use OTC Aleve, up to 2 tablets twice daily for the next week or so  If the aleve doesn't help or if still having back pain after another week, then let me know  We will make a referral for the finger deformity and pain  I recommend we get you into Urology for abnormal PSA/prostate lab

## 2015-03-24 NOTE — Patient Instructions (Signed)
Encounter Diagnoses  Name Primary?  . Low back pain, unspecified back pain laterality, with sciatica presence unspecified Yes  . Abdominal bloating   . Flatulence   . Finger deformity, acquired, right   . Elevated PSA    Recommendations:  Being Nexium samples once daily in the morning until you run out  Begin IBgard samples, 1 twice daily until you run out  Avoid foods that make you gassy such as fried foods, beans, onions, heavy portions  If not improving within 2-3 weeks, then recheck  For back strain, avoid heavy lifting for the next week  You can use OTC Aleve, up to 2 tablets twice daily for the next week or so  If the aleve doesn't help or if still having back pain after another week, then let me know  We will make a referral for the finger deformity and pain  I recommend we get you into Urology for abnormal PSA/prostate lab

## 2015-03-24 NOTE — Telephone Encounter (Signed)
Called pt twice on both numbers no answer

## 2015-04-04 ENCOUNTER — Other Ambulatory Visit: Payer: Self-pay | Admitting: Medical

## 2015-04-30 ENCOUNTER — Other Ambulatory Visit: Payer: Self-pay | Admitting: Medical

## 2015-07-27 ENCOUNTER — Ambulatory Visit (INDEPENDENT_AMBULATORY_CARE_PROVIDER_SITE_OTHER): Payer: BLUE CROSS/BLUE SHIELD | Admitting: Medical

## 2015-07-27 ENCOUNTER — Encounter: Payer: Self-pay | Admitting: Medical

## 2015-07-27 VITALS — BP 140/90 | HR 61 | Temp 97.5°F | Wt 226.0 lb

## 2015-07-27 DIAGNOSIS — I10 Essential (primary) hypertension: Secondary | ICD-10-CM | POA: Diagnosis not present

## 2015-07-27 DIAGNOSIS — R42 Dizziness and giddiness: Secondary | ICD-10-CM

## 2015-07-27 DIAGNOSIS — R972 Elevated prostate specific antigen [PSA]: Secondary | ICD-10-CM

## 2015-07-27 DIAGNOSIS — N400 Enlarged prostate without lower urinary tract symptoms: Secondary | ICD-10-CM

## 2015-07-27 NOTE — Progress Notes (Signed)
Subjective: Chief Complaint  Patient presents with  . dizzy and weak    states it looks like he was only taking on bp pill when he was supposed to take 2 said this was going on for a month. thinks that is what caused this   He is here for dizziness.   Was up high on chair painting walls and ceiling in his home, felt a little dizzy.   Felt dizziness for a few minutes.   Came off the chair and rested.  The dizziness finally resolved.   This happened once. However he hadn't been feeling his best for a few weeks.   Recently when he went to beach on vacation, he mixed some of his pills up and instead of taking one of his blood pressure pills, he had accident confused this with an OTC prostate pill.  So for about a 3 weeks he was mistaking taking just 1 of his BP medications and a prostate ill instead of both his BP pills.   He feels ok today.   He notes that last night he realized the mistake.  Thus, he attributes the recent symptoms to the mishap with his medication.    Denies chest pain, edema, no SOB.  No blurred vision, no numbness or tingling.  No other aggravating or relieving factors. No other complaint.  Doesn't check BP regularly.  Past Medical History  Diagnosis Date  . Hypertension   . Hyperlipidemia   . BPH (benign prostatic hypertrophy)   . Arthritis     right knee  . Obesity   . Full dentures   . Bulging lumbar disc     self reported   Current Outpatient Prescriptions on File Prior to Visit  Medication Sig Dispense Refill  . amLODipine (NORVASC) 10 MG tablet TAKE 1 TABLET (10 MG TOTAL) BY MOUTH DAILY. 90 tablet 0  . aspirin EC 81 MG tablet Take 1 tablet (81 mg total) by mouth daily. 90 tablet 3  . atorvastatin (LIPITOR) 40 MG tablet Take 1 tablet (40 mg total) by mouth daily at 6 PM. TAKE 1 TABLET (40 MG TOTAL) BY MOUTH DAILY. 90 tablet 3  . lisinopril-hydrochlorothiazide (PRINZIDE,ZESTORETIC) 20-12.5 MG tablet TAKE 1 TABLET BY MOUTH DAILY. 90 tablet 0   No current  facility-administered medications on file prior to visit.     ROS as in subjective   Objective: BP 140/90 mmHg  Pulse 61  Temp(Src) 97.5 F (36.4 C) (Tympanic)  Wt 226 lb (102.513 kg)  General appearance: alert, no distress, WD/WN HEENT: normocephalic, sclerae anicteric, PERRLA, EOMi, nares patent, no discharge or erythema, pharynx normal Oral cavity: MMM, no lesions Neck: supple, no lymphadenopathy, no thyromegaly, no masses, no bruits Heart: RRR, normal S1, S2, no murmurs Lungs: CTA bilaterally, no wheezes, rhonchi, or rales Extremities: no edema, no cyanosis, no clubbing Pulses: 2+ symmetric, upper and lower extremities, normal cap refill Neurological: alert, oriented x 3, CN2-12 intact, strength normal upper extremities and lower extremities, sensation normal throughout, DTRs 2+ throughout, no cerebellar signs, gait normal Psychiatric: normal affect, behavior normal, pleasant     Assessment: Encounter Diagnoses  Name Primary?  . Dizziness Yes  . Essential hypertension   . BPH (benign prostatic hypertrophy)   . Elevated PSA      Plan: Dizziness - seems to be related to the recent mix up with his medication at home.   He realized this yesterday and now has the pills sorted out correctly per his report.  Advised if any  question bring the medication here or go consult with the pharmacist.  He had 1 episode of dizziness, brief and it resolved.  He declines any other eval today  HTN - running a little high. C/t same 2 medications but advised he monitor and record BP readings.   F/u in mid July for physical and recheck.  BPH, elevated PSA - reviewed this past year's urology notes and they feel his BPH is stable and no worries for prostate cancer.  He is taking OTC herbal remedy for BPH and not currently having BPH compliant.   F/u in mid July for physical or sooner prn.

## 2015-08-02 ENCOUNTER — Other Ambulatory Visit: Payer: Self-pay | Admitting: Medical

## 2015-08-19 ENCOUNTER — Telehealth: Payer: Self-pay

## 2015-08-19 NOTE — Telephone Encounter (Signed)
Faxed request for refill of atorvastatin 90 day supply to [facility] [phone].   Thanks, RLB

## 2015-08-19 NOTE — Telephone Encounter (Signed)
Not due for refill until 09/16/15 deny 

## 2015-08-22 ENCOUNTER — Telehealth: Payer: Self-pay | Admitting: Medical

## 2015-08-22 NOTE — Telephone Encounter (Signed)
Rcvd 90 day refill request for Atorvastatin 40 mg

## 2015-08-22 NOTE — Telephone Encounter (Signed)
Not due for refill until 09/16/15 deny

## 2015-08-24 ENCOUNTER — Other Ambulatory Visit: Payer: Self-pay | Admitting: Medical

## 2015-10-05 ENCOUNTER — Encounter: Payer: BLUE CROSS/BLUE SHIELD | Admitting: Medical

## 2015-10-05 ENCOUNTER — Telehealth: Payer: Self-pay

## 2015-10-05 NOTE — Telephone Encounter (Signed)
D

## 2015-10-05 NOTE — Telephone Encounter (Signed)
This patient no showed for their appointment today.Which of the following is necessary for this patient.   A) No follow-up necessary   B) Follow-up urgent. Locate Patient Immediately.   C) Follow-up necessary. Contact patient and Schedule visit in ____ Days.   D) Follow-up Advised. Contact patient and Schedule visit in ____ Days. 

## 2015-10-07 ENCOUNTER — Encounter: Payer: Self-pay | Admitting: Medical

## 2015-10-07 NOTE — Telephone Encounter (Signed)
No show letter sent back to cma for pt call

## 2015-10-10 NOTE — Telephone Encounter (Signed)
attempted call to pt- VCM is full. Trixie Rude/RLB

## 2015-10-10 NOTE — Telephone Encounter (Signed)
When does pt need to return?

## 2015-10-10 NOTE — Telephone Encounter (Signed)
Reschedule in general soon, preferably within a month

## 2015-10-12 NOTE — Telephone Encounter (Signed)
vcm full.

## 2015-10-12 NOTE — Telephone Encounter (Signed)
Attempted 2 calls to pt- VCM full. Can you please send no show letter for pt to [facility]. Thanks

## 2015-10-14 NOTE — Telephone Encounter (Signed)
done

## 2015-11-15 ENCOUNTER — Other Ambulatory Visit: Payer: Self-pay | Admitting: Medical

## 2015-11-18 ENCOUNTER — Telehealth: Payer: Self-pay

## 2015-11-18 MED ORDER — LISINOPRIL-HYDROCHLOROTHIAZIDE 20-12.5 MG PO TABS: 1.0000 | ORAL_TABLET | Freq: Every day | ORAL | 0 refills | Status: DC

## 2015-11-18 NOTE — Telephone Encounter (Signed)
Pt called the office to reschedule CPE and needs refill of Lisinopril- HCTZ. Pt r/s for Oct 12 CPE, 30 day supply rx called to pharmacy. Trixie Rude/RLB

## 2015-11-28 ENCOUNTER — Other Ambulatory Visit: Payer: Self-pay | Admitting: Medical

## 2015-12-08 ENCOUNTER — Other Ambulatory Visit: Payer: Self-pay | Admitting: Medical

## 2015-12-08 NOTE — Telephone Encounter (Signed)
Needs appointment

## 2015-12-15 ENCOUNTER — Ambulatory Visit (INDEPENDENT_AMBULATORY_CARE_PROVIDER_SITE_OTHER): Payer: BLUE CROSS/BLUE SHIELD | Admitting: Medical

## 2015-12-15 ENCOUNTER — Other Ambulatory Visit: Payer: Self-pay | Admitting: Medical

## 2015-12-15 ENCOUNTER — Encounter: Payer: Self-pay | Admitting: Medical

## 2015-12-15 VITALS — BP 126/88 | HR 73 | Ht 66.75 in | Wt 227.0 lb

## 2015-12-15 DIAGNOSIS — M5136 Other intervertebral disc degeneration, lumbar region: Secondary | ICD-10-CM | POA: Insufficient documentation

## 2015-12-15 DIAGNOSIS — Z2821 Immunization not carried out because of patient refusal: Secondary | ICD-10-CM | POA: Diagnosis not present

## 2015-12-15 DIAGNOSIS — I10 Essential (primary) hypertension: Secondary | ICD-10-CM | POA: Diagnosis not present

## 2015-12-15 DIAGNOSIS — Z0000 Encounter for general adult medical examination without abnormal findings: Secondary | ICD-10-CM | POA: Diagnosis not present

## 2015-12-15 DIAGNOSIS — Z7189 Other specified counseling: Secondary | ICD-10-CM

## 2015-12-15 DIAGNOSIS — R7301 Impaired fasting glucose: Secondary | ICD-10-CM | POA: Diagnosis not present

## 2015-12-15 DIAGNOSIS — N401 Enlarged prostate with lower urinary tract symptoms: Secondary | ICD-10-CM

## 2015-12-15 DIAGNOSIS — Z23 Encounter for immunization: Secondary | ICD-10-CM | POA: Diagnosis not present

## 2015-12-15 DIAGNOSIS — Z131 Encounter for screening for diabetes mellitus: Secondary | ICD-10-CM | POA: Insufficient documentation

## 2015-12-15 DIAGNOSIS — M20001 Unspecified deformity of right finger(s): Secondary | ICD-10-CM | POA: Diagnosis not present

## 2015-12-15 DIAGNOSIS — Z7185 Encounter for immunization safety counseling: Secondary | ICD-10-CM | POA: Insufficient documentation

## 2015-12-15 DIAGNOSIS — E785 Hyperlipidemia, unspecified: Secondary | ICD-10-CM | POA: Diagnosis not present

## 2015-12-15 LAB — POCT URINALYSIS DIPSTICK
Bilirubin, UA: NEGATIVE
Blood, UA: NEGATIVE
Glucose, UA: NEGATIVE
KETONES UA: NEGATIVE
Leukocytes, UA: NEGATIVE
Nitrite, UA: NEGATIVE
PH UA: 7
PROTEIN UA: NEGATIVE
SPEC GRAV UA: 1.02
Urobilinogen, UA: NEGATIVE

## 2015-12-15 LAB — COMPREHENSIVE METABOLIC PANEL
ALT: 20 U/L (ref 9–46)
AST: 24 U/L (ref 10–35)
Albumin: 4.1 g/dL (ref 3.6–5.1)
Alkaline Phosphatase: 82 U/L (ref 40–115)
BUN: 10 mg/dL (ref 7–25)
CALCIUM: 9.1 mg/dL (ref 8.6–10.3)
CHLORIDE: 103 mmol/L (ref 98–110)
CO2: 27 mmol/L (ref 20–31)
Creat: 0.81 mg/dL (ref 0.70–1.25)
GLUCOSE: 115 mg/dL — AB (ref 65–99)
POTASSIUM: 3.9 mmol/L (ref 3.5–5.3)
Sodium: 139 mmol/L (ref 135–146)
Total Bilirubin: 0.9 mg/dL (ref 0.2–1.2)
Total Protein: 6.7 g/dL (ref 6.1–8.1)

## 2015-12-15 LAB — CBC
HCT: 41.2 % (ref 38.5–50.0)
Hemoglobin: 14.1 g/dL (ref 13.2–17.1)
MCH: 30 pg (ref 27.0–33.0)
MCHC: 34.2 g/dL (ref 32.0–36.0)
MCV: 87.7 fL (ref 80.0–100.0)
MPV: 10.4 fL (ref 7.5–12.5)
PLATELETS: 296 10*3/uL (ref 140–400)
RBC: 4.7 MIL/uL (ref 4.20–5.80)
RDW: 13.7 % (ref 11.0–15.0)
WBC: 5.6 10*3/uL (ref 4.0–10.5)

## 2015-12-15 LAB — LIPID PANEL
CHOL/HDL RATIO: 2.6 ratio (ref <=5.0)
CHOLESTEROL: 153 mg/dL (ref 125–200)
HDL: 60 mg/dL (ref >=40)
LDL Cholesterol: 80 mg/dL (ref <130)
Triglycerides: 67 mg/dL (ref <150)
VLDL: 13 mg/dL (ref <30)

## 2015-12-15 MED ORDER — LISINOPRIL-HYDROCHLOROTHIAZIDE 20-12.5 MG PO TABS: 1.0000 | ORAL_TABLET | Freq: Every day | ORAL | 3 refills | Status: DC

## 2015-12-15 MED ORDER — ASPIRIN EC 81 MG PO TBEC: 81.0000 mg | DELAYED_RELEASE_TABLET | Freq: Every day | ORAL | 3 refills | Status: DC

## 2015-12-15 MED ORDER — AMLODIPINE BESYLATE 10 MG PO TABS: 10.0000 mg | ORAL_TABLET | Freq: Every day | ORAL | 3 refills | Status: DC

## 2015-12-15 MED ORDER — ATORVASTATIN CALCIUM 40 MG PO TABS: ORAL_TABLET | ORAL | 0 refills | Status: DC

## 2015-12-15 NOTE — Progress Notes (Signed)
Subjective:   HPI  Luke RiggsDavid L Orr is a 65 y.o. male who presents for a complete physical.  No particular c/o other than some mild reflux recently.   Medical care team includes:  Dr. Annabell HowellsWrenn, Urology  Glenden Rossell, [facility], PA-C here for primary care   Reviewed their medical, surgical, family, social, medication, and allergy history and updated chart as appropriate.  Past Medical History:  Diagnosis Date  . Arthritis    right knee  . BPH (benign prostatic hypertrophy)    with urinary urgency and weak stream, followed by Dr. Bjorn PippinJohn Wrenn, [facility]  . Bulging lumbar disc    self reported  . DDD (degenerative disc disease), lumbar   . Elevated PSA    Dr. Annabell HowellsWrenn, [facility]  . Full dentures   . Hyperlipidemia   . Hypertension   . Impaired fasting blood sugar   . Obesity   . Obesity     Past Surgical History:  Procedure Laterality Date  . COLONOSCOPY  2015   mild diverticulosis, repeat 2025; Dr. Leone PayorGessner  . INGUINAL HERNIA REPAIR Right 1995  . PROSTATE BIOPSY  2014   x 2; Dr. Annabell HowellsWrenn    Social History   Social History  . Marital status: Unknown    Spouse name: N/A  . Number of children: N/A  . Years of education: N/A   Occupational History  . Not on file.   Social History Main Topics  . Smoking status: Former Smoker    Packs/day: 1.00    Years: 5.00    Quit date: 03/06/2003  . Smokeless tobacco: Never Used  . Alcohol use 10.2 oz/week    7 Glasses of wine, 10 Cans of beer per week  . Drug use: No  . Sexual activity: Not on file   Other Topics Concern  . 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  As of 12/2015    Family History  Problem Relation Age of Onset  . Heart disease Mother   . Diabetes Mother   . Hypertension Mother   . Heart disease Father   . Hypertension Sister   . Diabetes Sister   . Hypertension Brother   . Cancer Brother 50     prostate  . Heart disease Brother [phone]    CABG  . Hypertension Sister   . Diabetes Sister   . Hypertension Sister   . Diabetes Sister   . Hypertension Sister   . Hypertension Sister   . Hypertension Brother   . Diabetes Brother   . Colon cancer Neg Hx   . Stroke Neg Hx      Current Outpatient Prescriptions:  .  amLODipine (NORVASC) 10 MG tablet, Take 1 tablet (10 mg total) by mouth daily., Disp: 90 tablet, Rfl: 3 .  aspirin EC 81 MG tablet, Take 1 tablet (81 mg total) by mouth daily., Disp: 90 tablet, Rfl: 3 .  atorvastatin (LIPITOR) 40 MG tablet, TAKE 1 TABLET (40 MG TOTAL) BY MOUTH DAILY AT 6 PM., Disp: 90 tablet, Rfl: 0 .  lisinopril-hydrochlorothiazide (PRINZIDE,ZESTORETIC) 20-12.5 MG tablet, Take 1 tablet by mouth daily., Disp: 90 tablet, Rfl: 3 .  Misc Natural Products (COMPLETE PROSTATE HEALTH PO), Take 2 tablets by mouth daily., Disp: , Rfl:   Allergies  Allergen Reactions  . Morphine And Related Shortness Of Breath    Review of Systems Constitutional: -fever, -chills, -  sweats, -unexpected weight change, -decreased appetite, -fatigue Allergy: -sneezing, -itching, -congestion Dermatology: -changing moles, --rash, -lumps ENT: -runny nose, -ear pain, -sore throat, -hoarseness, -sinus pain, -teeth pain, - ringing in ears, -hearing loss, -nosebleeds Cardiology: -chest pain, -palpitations, -swelling, -difficulty breathing when lying flat, -waking up short of breath Respiratory: -cough, -shortness of breath, -difficulty breathing with exercise or exertion, -wheezing, -coughing up blood Gastroenterology: -abdominal pain, -nausea, -vomiting, -diarrhea, -constipation, -blood in stool, -changes in bowel movement, -difficulty swallowing or eating Hematology: -bleeding, -bruising  Musculoskeletal: -joint aches, -muscle aches, -joint swelling, -back pain, -neck pain, -cramping, -changes in gait Ophthalmology: denies vision changes, eye redness, itching, discharge Urology: -burning with  urination, -difficulty urinating, -blood in urine, -urinary frequency, -urgency, -incontinence Neurology: -headache, -weakness, -tingling, -numbness, -memory loss, -falls, -dizziness Psychology: -depressed mood, -agitation, -sleep problems     Objective:   Physical Exam  BP 126/88   Pulse 73   Ht 5' 6.75" (1.695 m)   Wt 227 lb (103 kg)   SpO2 97%   BMI 35.82 kg/m   Wt Readings from Last 3 Encounters:  12/15/15 227 lb (103 kg)  07/27/15 226 lb (102.5 kg)  03/24/15 225 lb (102.1 kg)   BP Readings from Last 3 Encounters:  12/15/15 126/88  07/27/15 140/90  03/24/15 (!) 142/90    General appearance: alert, no distress, WD/WN, AA male Skin: scattered macules, no specific worrisome lesions, few skin tags of neck HEENT: normocephalic, conjunctiva/corneas normal, sclerae anicteric, PERRLA, EOMi, nares patent, there is a raised 3mm round papule of left inferolateral nare unchanged for years per patient, no discharge or erythema, pharynx normal Oral cavity: MMM, tongue normal, teeth - dentures upper and lower Neck: supple, no lymphadenopathy, no thyromegaly, no masses, normal ROM, no bruits Chest: non tender, normal shape and expansion Heart: RRR, normal S1, S2, no murmurs Lungs: CTA bilaterally, no wheezes, rhonchi, or rales Abdomen: +bs, soft, non tender, non distended, no masses, no hepatomegaly, no splenomegaly, no bruits Back: non tender, normal ROM, no scoliosis Musculoskeletal: upper extremities non tender, no obvious deformity, normal ROM throughout, lower extremities non tender, no obvious deformity, normal ROM throughout Extremities: no edema, no cyanosis, no clubbing Pulses: 2+ symmetric, upper and lower extremities, normal cap refill Neurological: alert, oriented x 3, CN2-12 intact, strength normal upper extremities and lower extremities, sensation normal throughout, DTRs 2+ throughout, no cerebellar signs, gait normal Psychiatric: normal affect, behavior normal, pleasant   GU/DRE - deferred/declined today   Assessment and Plan :    Encounter Diagnoses  Name Primary?  . Encounter for health maintenance examination in adult Yes  . Essential hypertension   . Hyperlipidemia, unspecified hyperlipidemia type   . Benign prostatic hyperplasia with lower urinary tract symptoms, symptom details unspecified   . Acquired deformity of finger of right hand   . Impaired fasting blood sugar   . Lumbar degenerative disc disease   . Influenza vaccination declined   . Vaccine counseling   . Need for prophylactic vaccination against Streptococcus pneumoniae (pneumococcus)     Physical exam - discussed healthy lifestyle, diet, exercise, preventative care, vaccinations, and addressed their concerns.   See your eye doctor yearly for routine vision care. See your dentist yearly for routine dental care including hygiene visits twice yearly. HTN, hyperlipidemia- c/t same medications, labs today Obesity - need to do better with exercising, diet, work on lifestyle changes, weight loss BPH - managed by urology Nasal cavity mass - left, unchanged for years, he declines any ENT consult Counseled on the  pneumococcal vaccine.  Vaccine information sheet given.  Pneumococcal vaccine PPSV23 given after consent obtained. Advised he check insurance about shingles vaccine He declines influenza vaccine. Follow-up pending labs  Luke Orr was seen today for annual exam.  Diagnoses and all orders for this visit:  Encounter for health maintenance examination in adult -     POCT urinalysis dipstick -     Comprehensive metabolic panel -     Lipid panel -     CBC -     Hemoglobin A1c -     Microalbumin / creatinine urine ratio  Essential hypertension -     Comprehensive metabolic panel -     Microalbumin / creatinine urine ratio  Hyperlipidemia, unspecified hyperlipidemia type -     Lipid panel  Benign prostatic hyperplasia with lower urinary tract symptoms, symptom details  unspecified  Acquired deformity of finger of right hand  Impaired fasting blood sugar -     Hemoglobin A1c  Lumbar degenerative disc disease  Influenza vaccination declined  Vaccine counseling  Need for prophylactic vaccination against Streptococcus pneumoniae (pneumococcus) -     Pneumococcal polysaccharide vaccine 23-valent greater than or equal to 2yo subcutaneous/IM  Other orders -     amLODipine (NORVASC) 10 MG tablet; Take 1 tablet (10 mg total) by mouth daily. -     aspirin EC 81 MG tablet; Take 1 tablet (81 mg total) by mouth daily. -     lisinopril-hydrochlorothiazide (PRINZIDE,ZESTORETIC) 20-12.5 MG tablet; Take 1 tablet by mouth daily. -     atorvastatin (LIPITOR) 40 MG tablet; TAKE 1 TABLET (40 MG TOTAL) BY MOUTH DAILY AT 6 PM.

## 2015-12-15 NOTE — Patient Instructions (Signed)
Encounter Diagnoses  Name Primary?  . Encounter for health maintenance examination in adult Yes  . Essential hypertension   . Hyperlipidemia, unspecified hyperlipidemia type   . Benign prostatic hyperplasia with lower urinary tract symptoms, symptom details unspecified   . Acquired deformity of finger of right hand   . Impaired fasting blood sugar   . Lumbar degenerative disc disease   . Influenza vaccination declined   . Vaccine counseling   . Need for prophylactic vaccination against Streptococcus pneumoniae (pneumococcus)    Recommendations:  Continue your medications as usual  Exercise!  Such as walking for an hour daily  Avoid fast food, fried foods, ice cream, cake, chips, etc.   Eat healthy variety of foods such as fruits, vegetables, lean cuts of meat, small portions of whole grains  Go see an eye doctor  Check insurance coverage for the shingles vaccine   Ophthalmology Dr. Glenford PeersHoward McFarland [address], [place], [place] [address] [phone]   [facility] Dr. Gelene Minkimothy Koop [address], [address] [place], [place] [address]  [phone] [email or web address]   Vincenza HewsSigmund S. Gould, M.D. Susanne GreenhouseJason A. Gould, O.D. [address], [address] [place], [place] [address] Medical telephone: [phone] Optical telephone: [phone]

## 2015-12-16 ENCOUNTER — Other Ambulatory Visit: Payer: Self-pay | Admitting: Medical

## 2015-12-16 LAB — MICROALBUMIN / CREATININE URINE RATIO
CREATININE, URINE: 238 mg/dL (ref 20–370)
Microalb Creat Ratio: 6 mcg/mg creat (ref <30)
Microalb, Ur: 1.5 mg/dL

## 2015-12-16 LAB — HEMOGLOBIN A1C
Hgb A1c MFr Bld: 6.1 % — ABNORMAL HIGH (ref <5.7)
Mean Plasma Glucose: 128 mg/dL

## 2015-12-16 MED ORDER — ATORVASTATIN CALCIUM 40 MG PO TABS: ORAL_TABLET | ORAL | 3 refills | Status: DC

## 2016-08-07 ENCOUNTER — Encounter: Payer: Self-pay | Admitting: Medical

## 2016-08-07 ENCOUNTER — Ambulatory Visit (INDEPENDENT_AMBULATORY_CARE_PROVIDER_SITE_OTHER): Payer: BLUE CROSS/BLUE SHIELD | Admitting: Medical

## 2016-08-07 VITALS — BP 130/80 | HR 60 | Temp 98.1°F | Wt 227.2 lb

## 2016-08-07 DIAGNOSIS — R509 Fever, unspecified: Secondary | ICD-10-CM | POA: Insufficient documentation

## 2016-08-07 DIAGNOSIS — R7301 Impaired fasting glucose: Secondary | ICD-10-CM

## 2016-08-07 DIAGNOSIS — R61 Generalized hyperhidrosis: Secondary | ICD-10-CM | POA: Diagnosis not present

## 2016-08-07 DIAGNOSIS — R52 Pain, unspecified: Secondary | ICD-10-CM

## 2016-08-07 LAB — CBC WITH DIFFERENTIAL/PLATELET
BASOS ABS: 53 {cells}/uL (ref 0–200)
Basophils Relative: 1 %
EOS ABS: 159 {cells}/uL (ref 15–500)
Eosinophils Relative: 3 %
HCT: 43.8 % (ref 38.5–50.0)
HEMOGLOBIN: 14.5 g/dL (ref 13.2–17.1)
LYMPHS ABS: 1643 {cells}/uL (ref 850–3900)
Lymphocytes Relative: 31 %
MCH: 28.7 pg (ref 27.0–33.0)
MCHC: 33.1 g/dL (ref 32.0–36.0)
MCV: 86.6 fL (ref 80.0–100.0)
MPV: 10.2 fL (ref 7.5–12.5)
Monocytes Absolute: 901 cells/uL (ref 200–950)
Monocytes Relative: 17 %
NEUTROS ABS: 2544 {cells}/uL (ref 1500–7800)
Neutrophils Relative %: 48 %
Platelets: 253 10*3/uL (ref 140–400)
RBC: 5.06 MIL/uL (ref 4.20–5.80)
RDW: 14 % (ref 11.0–15.0)
WBC: 5.3 10*3/uL (ref 4.0–10.5)

## 2016-08-07 NOTE — Progress Notes (Signed)
Subjective:  Luke Orr is a 66 y.o. male who presents for fever. Chief Complaint  Patient presents with  . fever off and on    fever off and on for 3 nights   Accompanied by wife.  he notes the last 3 nights feeling flu like.  Been having subjective fever.  Sweats all the time, even in the winter.   No change in sweats.   No blood in stool or urine.  Has some sinus drainage at times, occasional cough.    Has felt some dizziness, nausea, body aches.  No chills.  No vomiting.   No diarrhea.  No rash.   No sick contacts.   No recent travel.  Works at home, Curator.   No SOB, no CP, no change in urination or bowels.  No hair or skin changes. No tick or other insect bites.  No other aggravating or relieving factors.    No other c/o.   The following portions of the patient's history were reviewed and updated as appropriate: allergies, current medications, past family history, past medical history, past social history, past surgical history and problem list.  ROS Otherwise as in subjective above  Past Medical History:  Diagnosis Date  . Arthritis    right knee  . BPH (benign prostatic hypertrophy)    with urinary urgency and weak stream, followed by Dr. Bjorn Orr, [facility]  . Bulging lumbar disc    self reported  . DDD (degenerative disc disease), lumbar   . Elevated PSA    Dr. Annabell Orr, [facility]  . Full dentures   . Hyperlipidemia   . Hypertension   . Impaired fasting blood sugar   . Obesity   . Obesity    Current Outpatient Prescriptions on File Prior to Visit  Medication Sig Dispense Refill  . amLODipine (NORVASC) 10 MG tablet Take 1 tablet (10 mg total) by mouth daily. 90 tablet 3  . aspirin EC 81 MG tablet Take 1 tablet (81 mg total) by mouth daily. 90 tablet 3  . atorvastatin (LIPITOR) 40 MG tablet TAKE 1 TABLET (40 MG TOTAL) BY MOUTH DAILY AT 6 PM. 90 tablet 3  . lisinopril-hydrochlorothiazide (PRINZIDE,ZESTORETIC) 20-12.5 MG tablet Take 1 tablet by  mouth daily. 90 tablet 3  . Misc Natural Products (COMPLETE PROSTATE HEALTH PO) Take 2 tablets by mouth daily.     No current facility-administered medications on file prior to visit.      Objective:Exam BP 130/80   Pulse 60   Temp 98.1 F (36.7 C) (Oral)   Wt 227 lb 3.2 oz (103.1 kg)   BMI 35.85 kg/m   Wt Readings from Last 3 Encounters:  08/07/16 227 lb 3.2 oz (103.1 kg)  12/15/15 227 lb (103 kg)  07/27/15 226 lb (102.5 kg)   General appearance: alert, no distress, WD/WN HEENT: normocephalic, sclerae anicteric, conjunctiva pink and moist, TMs pearly, nares patent, no discharge or erythema, pharynx normal Oral cavity: MMM, no lesions Neck: supple, no lymphadenopathy, no thyromegaly, no masses Heart: RRR, normal S1, S2, no murmurs Lungs: CTA bilaterally, no wheezes, rhonchi, or rales Abdomen: +bs, soft, non tender, non distended, no masses, no hepatomegaly, no splenomegaly Pulses: 2+ radial pulses, 2+ pedal pulses, normal cap refill Ext: no edema Skin: unremarkable   Assessment: Encounter Diagnoses  Name Primary?  . Fever, unspecified fever cause Yes  . Body aches   . Impaired fasting blood sugar   . Sweating increase      Plan: Etiology unclear.  No obvious tick bite, normal exam.   Labs today.  Hydrate well, use relative rest.   If documented fever >101, rash or new symptoms call back.  F/u pending labs.   [name] was seen today for fever off and on.  Diagnoses and all orders for this visit:  Fever, unspecified fever cause -     Basic metabolic panel -     CBC with Differential/Platelet -     Hemoglobin A1c -     TSH  Body aches -     Basic metabolic panel -     CBC with Differential/Platelet -     Hemoglobin A1c -     TSH  Impaired fasting blood sugar -     Basic metabolic panel -     CBC with Differential/Platelet -     Hemoglobin A1c -     TSH  Sweating increase -     Basic metabolic panel -     CBC with Differential/Platelet -     Hemoglobin  A1c -     TSH

## 2016-08-08 LAB — TSH: TSH: 1.83 mIU/L (ref 0.40–4.50)

## 2016-08-08 LAB — BASIC METABOLIC PANEL
BUN: 11 mg/dL (ref 7–25)
CHLORIDE: 101 mmol/L (ref 98–110)
CO2: 28 mmol/L (ref 20–31)
CREATININE: 0.95 mg/dL (ref 0.70–1.25)
Calcium: 9.4 mg/dL (ref 8.6–10.3)
GLUCOSE: 112 mg/dL — AB (ref 65–99)
Potassium: 3.7 mmol/L (ref 3.5–5.3)
SODIUM: 139 mmol/L (ref 135–146)

## 2016-08-08 LAB — HEMOGLOBIN A1C
Hgb A1c MFr Bld: 6.3 % — ABNORMAL HIGH (ref <5.7)
Mean Plasma Glucose: 134 mg/dL

## 2016-10-29 ENCOUNTER — Telehealth: Payer: Self-pay | Admitting: Medical

## 2016-10-29 NOTE — Telephone Encounter (Signed)
Call and schedule October physical, but also remind him to call [facility].  They send me note that he hasn't followed up there, I believe regarding elevated PSA.

## 2016-10-29 NOTE — Telephone Encounter (Signed)
Called and l/m for pt call us back to set up an appt for cpe in oct.

## 2016-12-03 ENCOUNTER — Other Ambulatory Visit: Payer: Self-pay | Admitting: Medical

## 2016-12-11 ENCOUNTER — Ambulatory Visit (INDEPENDENT_AMBULATORY_CARE_PROVIDER_SITE_OTHER): Payer: BLUE CROSS/BLUE SHIELD | Admitting: Medical

## 2016-12-11 ENCOUNTER — Encounter: Payer: Self-pay | Admitting: Medical

## 2016-12-11 VITALS — BP 132/80 | HR 59 | Wt 231.4 lb

## 2016-12-11 DIAGNOSIS — Z2821 Immunization not carried out because of patient refusal: Secondary | ICD-10-CM | POA: Diagnosis not present

## 2016-12-11 DIAGNOSIS — E785 Hyperlipidemia, unspecified: Secondary | ICD-10-CM | POA: Diagnosis not present

## 2016-12-11 DIAGNOSIS — I10 Essential (primary) hypertension: Secondary | ICD-10-CM

## 2016-12-11 DIAGNOSIS — R7301 Impaired fasting glucose: Secondary | ICD-10-CM

## 2016-12-11 NOTE — Progress Notes (Signed)
Subjective: Chief Complaint  Patient presents with  . med check    med check   Medical team: Tysinger, Kermit Balo, PA-C here for primary care Urology - Dr. Clearence Ped eye doctor  Sees urology for elevated PSA and BPH.   HTN - compliant with medication without c/o.  No CP, no SOB, no DOE.  Hyperlipidemia - compliant with cholesterol medication and aspirin daily  Not exercising much.  Walks the dog around the block.  Diet without much discretion.   Past Medical History:  Diagnosis Date  . Arthritis    right knee  . BPH (benign prostatic hypertrophy)    with urinary urgency and weak stream, followed by Dr. Bjorn Pippin, [facility]  . Bulging lumbar disc    self reported  . DDD (degenerative disc disease), lumbar   . Elevated PSA    Dr. Annabell Howells, [facility]  . Full dentures   . Hyperlipidemia   . Hypertension   . Impaired fasting blood sugar   . Obesity   . Obesity    Current Outpatient Prescriptions on File Prior to Visit  Medication Sig Dispense Refill  . amLODipine (NORVASC) 10 MG tablet Take 1 tablet (10 mg total) by mouth daily. 90 tablet 3  . aspirin EC 81 MG tablet Take 1 tablet (81 mg total) by mouth daily. 90 tablet 3  . atorvastatin (LIPITOR) 40 MG tablet TAKE 1 TABLET (40 MG TOTAL) BY MOUTH DAILY AT 6 PM. 90 tablet 3  . lisinopril-hydrochlorothiazide (PRINZIDE,ZESTORETIC) 20-12.5 MG tablet TAKE 1 TABLET BY MOUTH DAILY. 90 tablet 0  . Misc Natural Products (COMPLETE PROSTATE HEALTH PO) Take 2 tablets by mouth daily.     No current facility-administered medications on file prior to visit.    ROS as in subjective   Objective: BP 132/80   Pulse (!) 59   Wt 231 lb 6.4 oz (105 kg)   SpO2 97%   BMI 36.51 kg/m   General appearence: alert, no distress, WD/WN,  Neck: supple, no lymphadenopathy, no thyromegaly, no masses, no bruits Heart: RRR, normal S1, S2, no murmurs Lungs: CTA bilaterally, no wheezes, rhonchi, or rales Abdomen: +bs, soft, non  tender, non distended, no masses, no hepatomegaly, no splenomegaly Extremities: no edema, no cyanosis, no clubbing Pulses: 2+ symmetric, upper and lower extremities, normal cap refill Neurological: alert, oriented x 3, CN2-12 intact, strength normal upper extremities and lower extremities, sensation normal throughout, DTRs 2+ throughout, no cerebellar signs, gait normal Psychiatric: normal affect, behavior normal, pleasant    Assessment: Encounter Diagnoses  Name Primary?  . Essential hypertension Yes  . Impaired fasting blood sugar   . Hyperlipidemia, unspecified hyperlipidemia type   . Influenza vaccination declined      Plan: HTN - c/t same medications, labs today  Impaired glucose - counseled on diet, exercise, and labs today  hyperlipidemia - labs today, c/t same mediation  declines flu shot  I recommend you have a shingles vaccine to help prevent shingles or herpes zoster outbreak.   Please call your insurer to inquire about coverage for the Shingrix vaccine given in 2 doses.   Some insurers cover this vaccine after age 60, some cover this after age 74.  If your insurer covers this, then call to schedule appointment to have this vaccine here.  See your eye doctor yearly for routine vision care.   Willman was seen today for med check.  Diagnoses and all orders for this visit:  Essential hypertension -  Hepatic function panel -     Hemoglobin A1c -     Lipid panel  Impaired fasting blood sugar -     Hepatic function panel -     Hemoglobin A1c -     Lipid panel  Hyperlipidemia, unspecified hyperlipidemia type -     Hepatic function panel -     Hemoglobin A1c -     Lipid panel  Influenza vaccination declined

## 2016-12-11 NOTE — Patient Instructions (Addendum)
Recommendations: Follow up with Dr. Annabell Howells  See your eye doctor yearly for routine vision care.  Eye doctors: Dr. Glenford Peers [address] [place], [place] [address] [phone]   [facility] Dr. Gelene Mink [address], [place]. [address] [place], [place] [address]  [phone] [email or web address]   Vincenza Hews, M.D. Susanne Greenhouse, O.D. [address] [place], [place] [address] Medical telephone: [phone] Optical telephone: [phone]   Exercise regularly such as brisk walk or biking for 40+ minutes several days per week  I recommend a yearly flu shot  I recommend you have a shingles vaccine to help prevent shingles or herpes zoster outbreak.   Please call your insurer to inquire about coverage for the Shingrix vaccine given in 2 doses.   Some insurers cover this vaccine after age 27, some cover this after age 84.  If your insurer covers this, then call to schedule appointment to have this vaccine here.

## 2016-12-12 ENCOUNTER — Other Ambulatory Visit: Payer: Self-pay | Admitting: Medical

## 2016-12-12 LAB — HEMOGLOBIN A1C
HEMOGLOBIN A1C: 6.1 %{Hb} — AB (ref <5.7)
Mean Plasma Glucose: 128 (calc)
eAG (mmol/L): 7.1 (calc)

## 2016-12-12 LAB — HEPATIC FUNCTION PANEL
AG Ratio: 1.7 (calc) (ref 1.0–2.5)
ALBUMIN MSPROF: 4 g/dL (ref 3.6–5.1)
ALT: 18 U/L (ref 9–46)
AST: 19 U/L (ref 10–35)
Alkaline phosphatase (APISO): 78 U/L (ref 40–115)
BILIRUBIN DIRECT: 0.2 mg/dL (ref 0.0–0.2)
BILIRUBIN TOTAL: 0.8 mg/dL (ref 0.2–1.2)
GLOBULIN: 2.3 g/dL (ref 1.9–3.7)
Indirect Bilirubin: 0.6 mg/dL (calc) (ref 0.2–1.2)
Total Protein: 6.3 g/dL (ref 6.1–8.1)

## 2016-12-12 LAB — LIPID PANEL
CHOL/HDL RATIO: 3 (calc) (ref <5.0)
Cholesterol: 157 mg/dL (ref <200)
HDL: 52 mg/dL (ref >40)
LDL CHOLESTEROL (CALC): 86 mg/dL
Non-HDL Cholesterol (Calc): 105 mg/dL (calc) (ref <130)
Triglycerides: 91 mg/dL (ref <150)

## 2016-12-12 MED ORDER — LISINOPRIL-HYDROCHLOROTHIAZIDE 20-12.5 MG PO TABS: 1.0000 | ORAL_TABLET | Freq: Every day | ORAL | 1 refills | Status: DC

## 2016-12-12 MED ORDER — ATORVASTATIN CALCIUM 40 MG PO TABS: ORAL_TABLET | ORAL | 1 refills | Status: DC

## 2016-12-12 MED ORDER — AMLODIPINE BESYLATE 10 MG PO TABS: 10.0000 mg | ORAL_TABLET | Freq: Every day | ORAL | 1 refills | Status: DC

## 2016-12-12 MED ORDER — ASPIRIN EC 81 MG PO TBEC: 81.0000 mg | DELAYED_RELEASE_TABLET | Freq: Every day | ORAL | 1 refills | Status: DC

## 2017-03-14 ENCOUNTER — Ambulatory Visit: Payer: Self-pay | Admitting: Medical

## 2017-03-18 ENCOUNTER — Encounter: Payer: Self-pay | Admitting: Medical

## 2017-03-18 NOTE — Progress Notes (Signed)
.  first

## 2017-03-29 ENCOUNTER — Encounter: Payer: Self-pay | Admitting: Medical

## 2017-03-29 ENCOUNTER — Ambulatory Visit: Payer: BLUE CROSS/BLUE SHIELD | Admitting: Medical

## 2017-03-29 VITALS — Wt 224.6 lb

## 2017-03-29 DIAGNOSIS — R7301 Impaired fasting glucose: Secondary | ICD-10-CM | POA: Diagnosis not present

## 2017-03-29 DIAGNOSIS — I10 Essential (primary) hypertension: Secondary | ICD-10-CM | POA: Diagnosis not present

## 2017-03-29 DIAGNOSIS — E785 Hyperlipidemia, unspecified: Secondary | ICD-10-CM | POA: Diagnosis not present

## 2017-03-29 DIAGNOSIS — Z8249 Family history of ischemic heart disease and other diseases of the circulatory system: Secondary | ICD-10-CM | POA: Diagnosis not present

## 2017-03-29 DIAGNOSIS — R42 Dizziness and giddiness: Secondary | ICD-10-CM

## 2017-03-29 LAB — BASIC METABOLIC PANEL
BUN/Creatinine Ratio: 13 (ref 10–24)
BUN: 12 mg/dL (ref 8–27)
CHLORIDE: 101 mmol/L (ref 96–106)
CO2: 29 mmol/L (ref 20–29)
Calcium: 10 mg/dL (ref 8.6–10.2)
Creatinine, Ser: 0.96 mg/dL (ref 0.76–1.27)
GFR calc non Af Amer: 83 mL/min/{1.73_m2} (ref >59)
GFR, EST AFRICAN AMERICAN: 96 mL/min/{1.73_m2} (ref >59)
GLUCOSE: 109 mg/dL — AB (ref 65–99)
POTASSIUM: 4.4 mmol/L (ref 3.5–5.2)
Sodium: 140 mmol/L (ref 134–144)

## 2017-03-29 MED ORDER — MECLIZINE HCL 25 MG PO TABS: 25.0000 mg | ORAL_TABLET | Freq: Two times a day (BID) | ORAL | 0 refills | Status: DC

## 2017-03-29 NOTE — Progress Notes (Signed)
Subjective: Chief Complaint  Patient presents with  . Dizziness    dizziness one morning  when got up on wednesday    Here for dizzy spell when he awoke 2 mornings ago.  Went to lie back down for a while. Felt like room was spinning.  Finally went back to bed and went to sleep   Awoke later at 11am and felt better.  Hasn't had as significant dizziness since then but has felt "off".  That morning of the dizziness, had some stopped up nose, but seems ok now.   Denies associated chest pain, dyspnea, no numbness, no tingling, no weakness, no vision or hearing changes, no slurred speech.   Felt a little off all day Wednesday.  Has gotten some better.  He denies eating a lot of salt, but ate a bunch of crab legs the Monday and Tuesday, tasted salty but he said his wife didn't use salt.  No other aggravating or relieving factors. No other complaint.  Past Medical History:  Diagnosis Date  . Arthritis    right knee  . BPH (benign prostatic hypertrophy)    with urinary urgency and weak stream, followed by Dr. Bjorn PippinJohn Wrenn, [facility]  . Bulging lumbar disc    self reported  . DDD (degenerative disc disease), lumbar   . Elevated PSA    Dr. Annabell HowellsWrenn, [facility]  . Full dentures   . Hyperlipidemia   . Hypertension   . Impaired fasting blood sugar   . Obesity   . Obesity    Current Outpatient Medications on File Prior to Visit  Medication Sig Dispense Refill  . amLODipine (NORVASC) 10 MG tablet Take 1 tablet (10 mg total) by mouth daily. 90 tablet 1  . aspirin EC 81 MG tablet Take 1 tablet (81 mg total) by mouth daily. 90 tablet 1  . atorvastatin (LIPITOR) 40 MG tablet TAKE 1 TABLET (40 MG TOTAL) BY MOUTH DAILY AT 6 PM. 90 tablet 1  . lisinopril-hydrochlorothiazide (PRINZIDE,ZESTORETIC) 20-12.5 MG tablet Take 1 tablet by mouth daily. 90 tablet 1  . Misc Natural Products (COMPLETE PROSTATE HEALTH PO) Take 2 tablets by mouth daily.     No current facility-administered medications on file  prior to visit.    Family History  Problem Relation Age of Onset  . Heart disease Mother   . Diabetes Mother   . Hypertension Mother   . Heart disease Father   . Hypertension Sister   . Diabetes Sister   . Hypertension Brother   . Cancer Brother 50       prostate  . Heart disease Brother [phone]       CABG  . Hypertension Sister   . Diabetes Sister   . Hypertension Sister   . Diabetes Sister   . Hypertension Sister   . Hypertension Sister   . Hypertension Brother   . Diabetes Brother   . Colon cancer Neg Hx   . Stroke Neg Hx      ROS as in subjective   Objective: Wt 224 lb 9.6 oz (101.9 kg)   SpO2 98%   BMI 35.44 kg/m    orthostatic vitals reviewed as well, no significant findings  General appearance: alert, no distress, WD/WN,  HEENT: normocephalic, sclerae anicteric, PERRLA, EOMi, nares patent, no discharge or erythema, pharynx normal Oral cavity: MMM, no lesions Neck: supple, no lymphadenopathy, no thyromegaly, no masses, no bruits Heart: RRR, normal S1, S2, no murmurs Lungs: CTA bilaterally, no wheezes, rhonchi, or rales Extremities:  no edema, no cyanosis, no clubbing Pulses: 2+ symmetric, upper and lower extremities, normal cap refill Neurological: alert, oriented x 3, CN2-12 intact, strength normal upper extremities and lower extremities, sensation normal throughout, DTRs 2+ throughout, no cerebellar signs, gait normal Psychiatric: normal affect, behavior normal, pleasant    Adult ECG Report  Indication: dizziness  Rate: 53 bpm  Rhythm: sinus bradycardia  QRS Axis: 65 degrees  PR Interval:  QRS Duration:  QTc:  Conduction Disturbances: none  Other Abnormalities: possible widening of QRS in V2, V3  Patient's cardiac risk factors are: advanced age (older than 13 for men, 64 for women), dyslipidemia, hypertension and male gender.  EKG comparison: 2015  Narrative Interpretation: no acute changes    Assessment: Encounter Diagnoses   Name Primary?  . Dizziness Yes  . Vertigo   . Essential hypertension   . Impaired fasting blood sugar   . Hyperlipidemia, unspecified hyperlipidemia type   . Family history of heart disease     Plan: discussed symptoms and possible differential.  Possibly vertigo.  Begin trial of Meclizine, rest hydrate well, avoid sudden motion and take time with moving around.  Discussed fall prevention.    C/t same medication.  Will check BMET lab.   EKG unchanged.   In general, he has cardiac risk factors, significant family history of heart disease.   He reportedly had heart enlargement on echo years ago.  I will refer to cardiology for baseline eval, likely echo or stress test.  If worse symptoms or new symptoms over weekend, call or get rechecked.    Emerick was seen today for dizziness.  Diagnoses and all orders for this visit:  Dizziness -     Cancel: Basic metabolic panel -     Basic metabolic panel -     EKG 12-Lead  Vertigo -     Basic metabolic panel -     EKG 12-Lead  Essential hypertension -     Basic metabolic panel -     EKG 12-Lead -     Ambulatory referral to Cardiology  Impaired fasting blood sugar -     Ambulatory referral to Cardiology  Hyperlipidemia, unspecified hyperlipidemia type -     Ambulatory referral to Cardiology  Family history of heart disease  Other orders -     meclizine (ANTIVERT) 25 MG tablet; Take 1 tablet (25 mg total) by mouth 2 (two) times daily.

## 2017-04-02 LAB — BASIC METABOLIC PANEL

## 2017-04-18 ENCOUNTER — Encounter: Payer: Self-pay | Admitting: Internal Medicine

## 2017-06-20 ENCOUNTER — Other Ambulatory Visit: Payer: Self-pay | Admitting: Medical

## 2017-09-15 ENCOUNTER — Other Ambulatory Visit: Payer: Self-pay | Admitting: Medical

## 2017-09-23 ENCOUNTER — Other Ambulatory Visit: Payer: Self-pay | Admitting: Medical

## 2017-10-16 ENCOUNTER — Other Ambulatory Visit: Payer: Self-pay | Admitting: Medical

## 2017-10-30 ENCOUNTER — Telehealth: Payer: Self-pay | Admitting: Medical

## 2017-10-30 ENCOUNTER — Encounter: Payer: Self-pay | Admitting: Medical

## 2017-10-30 NOTE — Telephone Encounter (Signed)
forwarding to you  °

## 2017-10-30 NOTE — Telephone Encounter (Signed)
No show today?  Send letter or fee, inquire about the no show

## 2017-11-11 ENCOUNTER — Encounter: Payer: Self-pay | Admitting: Medical

## 2017-11-11 NOTE — Telephone Encounter (Signed)
No show letter with fee sent. Sending to [facility] to access charges.

## 2017-11-16 ENCOUNTER — Other Ambulatory Visit: Payer: Self-pay | Admitting: Medical

## 2017-11-27 ENCOUNTER — Other Ambulatory Visit: Payer: Self-pay | Admitting: Medical

## 2017-11-28 ENCOUNTER — Other Ambulatory Visit: Payer: Self-pay | Admitting: Medical

## 2017-12-02 ENCOUNTER — Other Ambulatory Visit: Payer: Self-pay

## 2017-12-02 ENCOUNTER — Telehealth: Payer: Self-pay | Admitting: Medical

## 2017-12-02 DIAGNOSIS — I10 Essential (primary) hypertension: Secondary | ICD-10-CM

## 2017-12-02 MED ORDER — AMLODIPINE BESYLATE 10 MG PO TABS: 10.0000 mg | ORAL_TABLET | Freq: Every day | ORAL | 0 refills | Status: DC

## 2017-12-02 MED ORDER — LISINOPRIL-HYDROCHLOROTHIAZIDE 20-12.5 MG PO TABS: 1.0000 | ORAL_TABLET | Freq: Every day | ORAL | 0 refills | Status: DC

## 2017-12-02 NOTE — Telephone Encounter (Signed)
Pt has Med Ck appt for tomorrow morning however wife, Lynden Ang, states pt need a few Amlodipine & Lisinopril today because he is out of med and blood pressure is not in control without the med.

## 2017-12-02 NOTE — Telephone Encounter (Signed)
Refills on his lisinopril and amlodipine sent to pharmacy.

## 2017-12-03 ENCOUNTER — Ambulatory Visit: Payer: BLUE CROSS/BLUE SHIELD | Admitting: Medical

## 2017-12-03 ENCOUNTER — Encounter: Payer: Self-pay | Admitting: Medical

## 2017-12-03 VITALS — BP 130/80 | HR 59 | Temp 98.0°F | Resp 16 | Ht 66.0 in | Wt 224.4 lb

## 2017-12-03 DIAGNOSIS — M19041 Primary osteoarthritis, right hand: Secondary | ICD-10-CM

## 2017-12-03 DIAGNOSIS — E669 Obesity, unspecified: Secondary | ICD-10-CM

## 2017-12-03 DIAGNOSIS — N401 Enlarged prostate with lower urinary tract symptoms: Secondary | ICD-10-CM

## 2017-12-03 DIAGNOSIS — I10 Essential (primary) hypertension: Secondary | ICD-10-CM | POA: Diagnosis not present

## 2017-12-03 DIAGNOSIS — E785 Hyperlipidemia, unspecified: Secondary | ICD-10-CM | POA: Diagnosis not present

## 2017-12-03 DIAGNOSIS — Z23 Encounter for immunization: Secondary | ICD-10-CM

## 2017-12-03 DIAGNOSIS — R7301 Impaired fasting glucose: Secondary | ICD-10-CM

## 2017-12-03 DIAGNOSIS — M19042 Primary osteoarthritis, left hand: Secondary | ICD-10-CM

## 2017-12-03 LAB — COMPREHENSIVE METABOLIC PANEL
ALK PHOS: 91 IU/L (ref 39–117)
ALT: 19 IU/L (ref 0–44)
AST: 20 IU/L (ref 0–40)
Albumin/Globulin Ratio: 1.9 (ref 1.2–2.2)
Albumin: 4.4 g/dL (ref 3.6–4.8)
BUN/Creatinine Ratio: 12 (ref 10–24)
BUN: 10 mg/dL (ref 8–27)
Bilirubin Total: 0.8 mg/dL (ref 0.0–1.2)
CALCIUM: 9.6 mg/dL (ref 8.6–10.2)
CO2: 27 mmol/L (ref 20–29)
CREATININE: 0.85 mg/dL (ref 0.76–1.27)
Chloride: 101 mmol/L (ref 96–106)
GFR calc Af Amer: 106 mL/min/{1.73_m2} (ref >59)
GFR calc non Af Amer: 92 mL/min/{1.73_m2} (ref >59)
GLOBULIN, TOTAL: 2.3 g/dL (ref 1.5–4.5)
GLUCOSE: 109 mg/dL — AB (ref 65–99)
POTASSIUM: 4 mmol/L (ref 3.5–5.2)
Sodium: 142 mmol/L (ref 134–144)
Total Protein: 6.7 g/dL (ref 6.0–8.5)

## 2017-12-03 LAB — LIPID PANEL
CHOL/HDL RATIO: 3.4 ratio (ref 0.0–5.0)
Cholesterol, Total: 191 mg/dL (ref 100–199)
HDL: 57 mg/dL (ref >39)
LDL CALC: 118 mg/dL — AB (ref 0–99)
TRIGLYCERIDES: 80 mg/dL (ref 0–149)
VLDL Cholesterol Cal: 16 mg/dL (ref 5–40)

## 2017-12-03 LAB — CBC
HEMOGLOBIN: 14.3 g/dL (ref 13.0–17.7)
Hematocrit: 43.3 % (ref 37.5–51.0)
MCH: 29.3 pg (ref 26.6–33.0)
MCHC: 33 g/dL (ref 31.5–35.7)
MCV: 89 fL (ref 79–97)
Platelets: 316 10*3/uL (ref 150–450)
RBC: 4.88 x10E6/uL (ref 4.14–5.80)
RDW: 13.9 % (ref 12.3–15.4)
WBC: 6.5 10*3/uL (ref 3.4–10.8)

## 2017-12-03 LAB — HEMOGLOBIN A1C
Est. average glucose Bld gHb Est-mCnc: 128 mg/dL
Hgb A1c MFr Bld: 6.1 % — ABNORMAL HIGH (ref 4.8–5.6)

## 2017-12-03 NOTE — Patient Instructions (Signed)
Shingles vaccine:  I recommend you have a shingles vaccine to help prevent shingles or herpes zoster outbreak.   Please call your insurer to inquire about coverage for the Shingrix vaccine given in 2 doses.   Some insurers cover this vaccine after age 65, some cover this after age 51.  If your insurer covers this, then call to schedule appointment to have this vaccine here.  EXERCISE!

## 2017-12-03 NOTE — Progress Notes (Signed)
Subjective: Chief Complaint  Patient presents with  . med check    med check    Medical team: Jennifer Payes, Kermit Balo, PA-C here for primary care Urology - Dr. Clearence Ped eye doctor  Been doing well, no particular concerns.   Not exercising much.   Is active though.   Walks the dog.    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     Sees urology for elevated PSA and BPH.   HTN - compliant with medication without c/o.  No CP, no SOB, no DOE.  Hyperlipidemia - compliant with cholesterol medication and aspirin daily  Not exercising much.  Walks the dog around the block.  Trying to eat a little healthier than last visit.     Past Medical History:  Diagnosis Date  . Arthritis    right knee  . BPH (benign prostatic hypertrophy)    with urinary urgency and weak stream, followed by Dr. Bjorn Pippin, [facility]  . Bulging lumbar disc    self reported  . DDD (degenerative disc disease), lumbar   . Elevated PSA    Dr. Annabell Howells, [facility]  . Full dentures   . Hyperlipidemia   . Hypertension   . Impaired fasting blood sugar   . Obesity   . Obesity    Current Outpatient Medications on File Prior to Visit  Medication Sig Dispense Refill  . amLODipine (NORVASC) 10 MG tablet Take 1 tablet (10 mg total) by mouth daily. 30 tablet 0  . aspirin EC 81 MG tablet Take 1 tablet (81 mg total) by mouth daily. 90 tablet 1  . atorvastatin (LIPITOR) 40 MG tablet TAKE 1 TABLET (40 MG TOTAL) BY MOUTH DAILY AT 6 PM. 30 tablet 0  . lisinopril-hydrochlorothiazide (PRINZIDE,ZESTORETIC) 20-12.5 MG tablet Take 1 tablet by mouth daily. 30 tablet 0  . meclizine (ANTIVERT) 25 MG tablet Take 1 tablet (25 mg total) by mouth 2 (two) times daily. (Patient not taking: Reported on 12/03/2017) 30 tablet 0  . Misc Natural Products (COMPLETE PROSTATE HEALTH PO) Take 2 tablets by mouth daily.     No current facility-administered medications on file prior to visit.    ROS as in subjective     Objective: BP 130/80   Pulse  (!) 59   Temp 98 F (36.7 C) (Oral)   Resp 16   Ht 5\' 6"  (1.676 m)   Wt 224 lb 6.4 oz (101.8 kg)   SpO2 96%   BMI 36.22 kg/m   General appearance: alert, no distress, WD/WN,  Neck: supple, no lymphadenopathy, no thyromegaly, no masses, no bruits Heart: RRR, normal S1, S2, no murmurs Lungs: CTA bilaterally, no wheezes, rhonchi, or rales Extremities: no edema, no cyanosis, no clubbing Pulses: 2+ symmetric, upper and lower extremities, normal cap refill Neurological: alert, oriented x 3, CN2-12 intact, strength normal upper extremities and lower extremities, sensation normal throughout, DTRs 2+ throughout, no cerebellar signs, gait normal Psychiatric: normal affect, behavior normal, pleasant  Generalized bony arthritic changes of MCPs of both hands   Assessment: Encounter Diagnoses  Name Primary?  . Essential hypertension Yes  . Impaired fasting blood sugar   . Benign prostatic hyperplasia with lower urinary tract symptoms, symptom details unspecified   . Hyperlipidemia, unspecified hyperlipidemia type   . Obesity with serious comorbidity, unspecified classification, unspecified obesity type   . Need for influenza vaccination   . Osteoarthritis of both hands, unspecified osteoarthritis type      Plan: HTN - c/t same medications, labs today, counseled  on need for exercise   Impaired glucose - counseled on diet, exercise, and labs today  hyperlipidemia - labs today, c/t same mediation  Counseled on the influenza virus vaccine.  Vaccine information sheet given.  Influenza vaccine given after consent obtained.  I recommend you have a shingles vaccine to help prevent shingles or herpes zoster outbreak.   Please call your insurer to inquire about coverage for the Shingrix vaccine given in 2 doses.   Some insurers cover this vaccine after age 79, some cover this after age 42.  If your insurer covers this, then call to schedule appointment to have this vaccine here.  OA hands - can  use Tylenol or topical Aspercreme prn  Luke Orr was seen today for med check.  Diagnoses and all orders for this visit:  Essential hypertension -     Comprehensive metabolic panel -     CBC -     Lipid panel -     Hemoglobin A1c  Impaired fasting blood sugar -     Hemoglobin A1c  Benign prostatic hyperplasia with lower urinary tract symptoms, symptom details unspecified  Hyperlipidemia, unspecified hyperlipidemia type -     Lipid panel  Obesity with serious comorbidity, unspecified classification, unspecified obesity type -     Comprehensive metabolic panel -     Lipid panel -     Hemoglobin A1c  Need for influenza vaccination  Osteoarthritis of both hands, unspecified osteoarthritis type

## 2017-12-04 ENCOUNTER — Other Ambulatory Visit: Payer: Self-pay | Admitting: Medical

## 2017-12-04 DIAGNOSIS — I10 Essential (primary) hypertension: Secondary | ICD-10-CM

## 2017-12-04 MED ORDER — LISINOPRIL-HYDROCHLOROTHIAZIDE 20-12.5 MG PO TABS: 1.0000 | ORAL_TABLET | Freq: Every day | ORAL | 3 refills | Status: DC

## 2017-12-04 MED ORDER — ROSUVASTATIN CALCIUM 20 MG PO TABS: 20.0000 mg | ORAL_TABLET | Freq: Every day | ORAL | 3 refills | Status: DC

## 2017-12-04 MED ORDER — ASPIRIN EC 81 MG PO TBEC: 81.0000 mg | DELAYED_RELEASE_TABLET | Freq: Every day | ORAL | 3 refills | Status: DC

## 2017-12-04 MED ORDER — AMLODIPINE BESYLATE 10 MG PO TABS: 10.0000 mg | ORAL_TABLET | Freq: Every day | ORAL | 3 refills | Status: DC

## 2018-01-17 ENCOUNTER — Telehealth: Payer: Self-pay

## 2018-01-17 NOTE — Telephone Encounter (Signed)
Called patient and gave him the phone # to [facility] and told him to call and schedule his appointment.   Patient states that he had not heard from them.

## 2018-03-05 DIAGNOSIS — Z9289 Personal history of other medical treatment: Secondary | ICD-10-CM

## 2018-03-05 DIAGNOSIS — R935 Abnormal findings on diagnostic imaging of other abdominal regions, including retroperitoneum: Secondary | ICD-10-CM

## 2018-03-05 HISTORY — DX: Abnormal findings on diagnostic imaging of other abdominal regions, including retroperitoneum: R93.5

## 2018-03-05 HISTORY — DX: Personal history of other medical treatment: Z92.89

## 2018-03-14 ENCOUNTER — Telehealth: Payer: Self-pay | Admitting: Medical

## 2018-03-14 ENCOUNTER — Encounter: Payer: Self-pay | Admitting: Medical

## 2018-03-14 NOTE — Telephone Encounter (Signed)
error 

## 2018-06-18 ENCOUNTER — Encounter: Payer: BLUE CROSS/BLUE SHIELD | Admitting: Medical

## 2018-08-05 ENCOUNTER — Ambulatory Visit (INDEPENDENT_AMBULATORY_CARE_PROVIDER_SITE_OTHER): Payer: Medicare Other | Admitting: Medical

## 2018-08-05 ENCOUNTER — Encounter: Payer: Self-pay | Admitting: Medical

## 2018-08-05 VITALS — BP 138/84 | HR 68 | Temp 97.7°F | Resp 16 | Ht 67.0 in | Wt 229.2 lb

## 2018-08-05 DIAGNOSIS — E785 Hyperlipidemia, unspecified: Secondary | ICD-10-CM

## 2018-08-05 DIAGNOSIS — R400 Somnolence: Secondary | ICD-10-CM | POA: Insufficient documentation

## 2018-08-05 DIAGNOSIS — I10 Essential (primary) hypertension: Secondary | ICD-10-CM

## 2018-08-05 DIAGNOSIS — Z7185 Encounter for immunization safety counseling: Secondary | ICD-10-CM

## 2018-08-05 DIAGNOSIS — Z1211 Encounter for screening for malignant neoplasm of colon: Secondary | ICD-10-CM

## 2018-08-05 DIAGNOSIS — Z0000 Encounter for general adult medical examination without abnormal findings: Secondary | ICD-10-CM

## 2018-08-05 DIAGNOSIS — R972 Elevated prostate specific antigen [PSA]: Secondary | ICD-10-CM

## 2018-08-05 DIAGNOSIS — R0600 Dyspnea, unspecified: Secondary | ICD-10-CM

## 2018-08-05 DIAGNOSIS — N401 Enlarged prostate with lower urinary tract symptoms: Secondary | ICD-10-CM | POA: Diagnosis not present

## 2018-08-05 DIAGNOSIS — Z7189 Other specified counseling: Secondary | ICD-10-CM

## 2018-08-05 DIAGNOSIS — R7301 Impaired fasting glucose: Secondary | ICD-10-CM

## 2018-08-05 DIAGNOSIS — R0683 Snoring: Secondary | ICD-10-CM

## 2018-08-05 DIAGNOSIS — E669 Obesity, unspecified: Secondary | ICD-10-CM

## 2018-08-05 DIAGNOSIS — K4090 Unilateral inguinal hernia, without obstruction or gangrene, not specified as recurrent: Secondary | ICD-10-CM

## 2018-08-05 DIAGNOSIS — R5383 Other fatigue: Secondary | ICD-10-CM

## 2018-08-05 DIAGNOSIS — Z125 Encounter for screening for malignant neoplasm of prostate: Secondary | ICD-10-CM

## 2018-08-05 DIAGNOSIS — Z8249 Family history of ischemic heart disease and other diseases of the circulatory system: Secondary | ICD-10-CM

## 2018-08-05 DIAGNOSIS — Z131 Encounter for screening for diabetes mellitus: Secondary | ICD-10-CM

## 2018-08-05 LAB — LIPID PANEL

## 2018-08-05 NOTE — Patient Instructions (Addendum)
Thanks for trusting [place] with your health care and for coming in for a physical today.  Below are some general recommendations I have for you:  Yearly screenings See your eye doctor yearly for routine vision care. See your dentist yearly for routine dental care including hygiene visits twice yearly. See me here yearly for a routine physical and preventative care visit   Specific Concerns today:  Given the fatigue, daytime sleepiness, non-restful sleep, we will consider a sleep study  It is uncertain why you have the breathing concern-we may get a chest x-ray just to make sure that looks okay.  You had a normal heart work-up in January  Continue current medications for cholesterol and blood pressure   I recommend you have a flu shot every fall, and I recommend you check your insurance coverage for Prevnar 13 pneumonia vaccine and Shingrix shingles vaccine     Please follow up yearly for a physical.   Preventative Care for Adults - Male      MAINTAIN REGULAR HEALTH EXAMS:  A routine yearly physical is a good way to check in with your primary care provider about your health and preventive screening. It is also an opportunity to share updates about your health and any concerns you have, and receive a thorough all-over exam.   Most health insurance companies pay for at least some preventative services.  Check with your health plan for specific coverages.  WHAT PREVENTATIVE SERVICES DO MEN NEED?  Adult men should have their weight and blood pressure checked regularly.   Men age 85 and older should have their cholesterol levels checked regularly.  Beginning at age 66 and continuing to age 55, men should be screened for colorectal cancer.  Certain people may need continued testing until age 61.  Updating vaccinations is part of preventative care.  Vaccinations help protect against diseases such as the flu.  Osteoporosis is a disease in which the bones lose minerals and strength as  we age. Men ages 9 and over should discuss this with their caregivers  Lab tests are generally done as part of preventative care to screen for anemia and blood disorders, to screen for problems with the kidneys and liver, to screen for bladder problems, to check blood sugar, and to check your cholesterol level.  Preventative services generally include counseling about diet, exercise, avoiding tobacco, drugs, excessive alcohol consumption, and sexually transmitted infections.    GENERAL RECOMMENDATIONS FOR GOOD HEALTH:  Healthy diet:  Eat a variety of foods, including fruit, vegetables, animal or vegetable protein, such as meat, fish, chicken, and eggs, or beans, lentils, tofu, and grains, such as rice.  Drink plenty of water daily.  Decrease saturated fat in the diet, avoid lots of red meat, processed foods, sweets, fast foods, and fried foods.  Exercise:  Aerobic exercise helps maintain good heart health. At least 30-40 minutes of moderate-intensity exercise is recommended. For example, a brisk walk that increases your heart rate and breathing. This should be done on most days of the week.   Find a type of exercise or a variety of exercises that you enjoy so that it becomes a part of your daily life.  Examples are running, walking, swimming, water aerobics, and biking.  For motivation and support, explore group exercise such as aerobic class, spin class, Zumba, Yoga,or  martial arts, etc.    Set exercise goals for yourself, such as a certain weight goal, walk or run in a race such as a 5k walk/run.  Speak to your primary care provider about exercise goals.  Disease prevention:  If you smoke or chew tobacco, find out from your caregiver how to quit. It can literally save your life, no matter how long you have been a tobacco user. If you do not use tobacco, never begin.   Maintain a healthy diet and normal weight. Increased weight leads to problems with blood pressure and diabetes.    The Body Mass Index or BMI is a way of measuring how much of your body is fat. Having a BMI above 27 increases the risk of heart disease, diabetes, hypertension, stroke and other problems related to obesity. Your caregiver can help determine your BMI and based on it develop an exercise and dietary program to help you achieve or maintain this important measurement at a healthful level.  High blood pressure causes heart and blood vessel problems.  Persistent high blood pressure should be treated with medicine if weight loss and exercise do not work.   Fat and cholesterol leaves deposits in your arteries that can block them. This causes heart disease and vessel disease elsewhere in your body.  If your cholesterol is found to be high, or if you have heart disease or certain other medical conditions, then you may need to have your cholesterol monitored frequently and be treated with medication.   Ask if you should have a cardiac stress test if your history suggests this. A stress test is a test done on a treadmill that looks for heart disease. This test can find disease prior to there being a problem.  Osteoporosis is a disease in which the bones lose minerals and strength as we age. This can result in serious bone fractures. Risk of osteoporosis can be identified using a bone density scan. Men ages 85 and over should discuss this with their caregivers. Ask your caregiver whether you should be taking a calcium supplement and Vitamin D, to reduce the rate of osteoporosis.   Avoid drinking alcohol in excess (more than two drinks per day).  Avoid use of street drugs. Do not share needles with anyone. Ask for professional help if you need assistance or instructions on stopping the use of alcohol, cigarettes, and/or drugs.  Brush your teeth twice a day with fluoride toothpaste, and floss once a day. Good oral hygiene prevents tooth decay and gum disease. The problems can be painful, unattractive, and can cause  other health problems. Visit your dentist for a routine oral and dental check up and preventive care every 6-12 months.   Look at your skin regularly.  Use a mirror to look at your back. Notify your caregivers of changes in moles, especially if there are changes in shapes, colors, a size larger than a pencil eraser, an irregular border, or development of new moles.  Safety:  Use seatbelts 100% of the time, whether driving or as a passenger.  Use safety devices such as hearing protection if you work in environments with loud noise or significant background noise.  Use safety glasses when doing any work that could send debris in to the eyes.  Use a helmet if you ride a bike or motorcycle.  Use appropriate safety gear for contact sports.  Talk to your caregiver about gun safety.  Use sunscreen with a SPF (or skin protection factor) of 15 or greater.  Lighter skinned people are at a greater risk of skin cancer. Don't forget to also wear sunglasses in order to protect your eyes from too much damaging  sunlight. Damaging sunlight can accelerate cataract formation.   Practice safe sex. Use condoms. Condoms are used for birth control and to help reduce the spread of sexually transmitted infections (or STIs).  Some of the STIs are gonorrhea (the clap), chlamydia, syphilis, trichomonas, herpes, HPV (human papilloma virus) and HIV (human immunodeficiency virus) which causes AIDS. The herpes, HIV and HPV are viral illnesses that have no cure. These can result in disability, cancer and death.   Keep carbon monoxide and smoke detectors in your home functioning at all times. Change the batteries every 6 months or use a model that plugs into the wall.   Vaccinations:  Stay up to date with your tetanus shots and other required immunizations. You should have a booster for tetanus every 10 years. Be sure to get your flu shot every year, since 5%-20% of the [place] population comes down with the flu. The flu vaccine changes  each year, so being vaccinated once is not enough. Get your shot in the fall, before the flu season peaks.   Other vaccines to consider:  Human Papilloma Virus or HPV causes cancer of the cervix, and other infections that can be transmitted from person to person. There is a vaccine for HPV, and males should get immunized between the ages of 41 and 52. It requires a series of 3 shots.   Pneumococcal vaccine to protect against certain types of pneumonia.  This is normally recommended for adults age 52 or older.  However, adults younger than 66 years old with certain underlying conditions such as diabetes, heart or lung disease should also receive the vaccine.  Shingles vaccine to protect against Varicella Zoster if you are older than age 25, or younger than 66 years old with certain underlying illness.  If you have not had the Shingrix vaccine, please call your insurer to inquire about coverage for the Shingrix vaccine given in 2 doses.   Some insurers cover this vaccine after age 48, some cover this after age 26.  If your insurer covers this, then call to schedule appointment to have this vaccine here  Hepatitis A vaccine to protect against a form of infection of the liver by a virus acquired from food.  Hepatitis B vaccine to protect against a form of infection of the liver by a virus acquired from blood or body fluids, particularly if you work in health care.  If you plan to travel internationally, check with your local health department for specific vaccination recommendations.   What should I know about cancer screening? Many types of cancers can be detected early and may often be prevented. Lung Cancer  You should be screened every year for lung cancer if: ? You are a current smoker who has smoked for at least 30 years. ? You are a former smoker who has quit within the past 15 years.  Talk to your health care provider about your screening options, when you should start screening, and how  often you should be screened.  Colorectal Cancer  Routine colorectal cancer screening usually begins at 66 years of age and should be repeated every 5-10 years until you are 66 years old. You may need to be screened more often if early forms of precancerous polyps or small growths are found. Your health care provider may recommend screening at an earlier age if you have risk factors for colon cancer.  Your health care provider may recommend using home test kits to check for hidden blood in the stool.  A  small camera at the end of a tube can be used to examine your colon (sigmoidoscopy or colonoscopy). This checks for the earliest forms of colorectal cancer.  Prostate and Testicular Cancer  Depending on your age and overall health, your health care provider may do certain tests to screen for prostate and testicular cancer.  Talk to your health care provider about any symptoms or concerns you have about testicular or prostate cancer.  Skin Cancer  Check your skin from head to toe regularly.  Tell your health care provider about any new moles or changes in moles, especially if: ? There is a change in a mole's size, shape, or color. ? You have a mole that is larger than a pencil eraser.  Always use sunscreen. Apply sunscreen liberally and repeat throughout the day. Protect yourself by wearing long sleeves, pants, a wide-brimmed hat, and sunglasses when outside.     Advance Directive  Advance directives are legal documents that let you make choices ahead of time about your health care and medical treatment in case you become unable to communicate for yourself. Advance directives are a way for you to communicate your wishes to family, friends, and health care providers. This can help convey your decisions about end-of-life care if you become unable to communicate. Discussing and writing advance directives should happen over time rather than all at once. Advance directives can be changed  depending on your situation and what you want, even after you have signed the advance directives. If you do not have an advance directive, some states assign family decision makers to act on your behalf based on how closely you are related to them. Each state has its own laws regarding advance directives. You may want to check with your health care provider, attorney, or state representative about the laws in your state. There are different types of advance directives, such as:  Medical power of attorney.  Living will.  Do not resuscitate (DNR) or do not attempt resuscitation (DNAR) order. Health care proxy and medical power of attorney A health care proxy, also called a health care agent, is a person who is appointed to make medical decisions for you in cases in which you are unable to make the decisions yourself. Generally, people choose someone they know well and trust to represent their preferences. Make sure to ask this person for an agreement to act as your proxy. A proxy may have to exercise judgment in the event of a medical decision for which your wishes are not known. A medical power of attorney is a legal document that names your health care proxy. Depending on the laws in your state, after the document is written, it may also need to be:  Signed.  Notarized.  Dated.  Copied.  Witnessed.  Incorporated into your medical record. You may also want to appoint someone to manage your financial affairs in a situation in which you are unable to do so. This is called a durable power of attorney for finances. It is a separate legal document from the durable power of attorney for health care. You may choose the same person or someone different from your health care proxy to act as your agent in financial matters. If you do not appoint a proxy, or if there is a concern that the proxy is not acting in your best interests, a court-appointed guardian may be designated to act on your  behalf. Living will A living will is a set of instructions documenting your wishes  about medical care when you cannot express them yourself. Health care providers should keep a copy of your living will in your medical record. You may want to give a copy to family members or friends. To alert caregivers in case of an emergency, you can place a card in your wallet to let them know that you have a living will and where they can find it. A living will is used if you become:  Terminally ill.  Incapacitated.  Unable to communicate or make decisions. Items to consider in your living will include:  The use or non-use of life-sustaining equipment, such as dialysis machines and breathing machines (ventilators).  A DNR or DNAR order, which is the instruction not to use cardiopulmonary resuscitation (CPR) if breathing or heartbeat stops.  The use or non-use of tube feeding.  Withholding of food and fluids.  Comfort (palliative) care when the goal becomes comfort rather than a cure.  Organ and tissue donation. A living will does not give instructions for distributing your money and property if you should pass away. It is recommended that you seek the advice of a lawyer when writing a will. Decisions about taxes, beneficiaries, and asset distribution will be legally binding. This process can relieve your family and friends of any concerns surrounding disputes or questions that may come up about the distribution of your assets. DNR or DNAR A DNR or DNAR order is a request not to have CPR in the event that your heart stops beating or you stop breathing. If a DNR or DNAR order has not been made and shared, a health care provider will try to help any patient whose heart has stopped or who has stopped breathing. If you plan to have surgery, talk with your health care provider about how your DNR or DNAR order will be followed if problems occur. Summary  Advance directives are the legal documents that allow  you to make choices ahead of time about your health care and medical treatment in case you become unable to communicate for yourself.  The process of discussing and writing advance directives should happen over time. You can change the advance directives, even after you have signed them.  Advance directives include DNR or DNAR orders, living wills, and designating an agent as your medical power of attorney. This information is not intended to replace advice given to you by your health care provider. Make sure you discuss any questions you have with your health care provider. Document Released: 05/29/2007 Document Revised: 01/09/2016 Document Reviewed: 01/09/2016 [employer] Interactive Patient Education  2019 [employer].

## 2018-08-05 NOTE — Progress Notes (Signed)
Subjective:    Luke Orr is a 68 y.o. male who presents for Preventative Services Orr and chronic medical problems/med check Orr.    Primary Care Provider Orr, Luke Baloavid S, PA-C here for primary care  Current Health Care Team:  Dentist, Luke. none  Eye doctor, Luke. None  Urology, Luke Orr  Luke Orr with [facility]  Luke Orr, GI  Medical Services you may have received from other than [facility] providers in the past year (date may be approximate) [facility]   Exercise Current exercise habits: walk sometimes   Nutrition/Diet none   Depression Screen Depression screen [facility] 08/05/2018  Decreased Interest 0  Down, Depressed, Hopeless 0  PHQ - 2 Score 0    Activities of Daily Living Screen/Functional Status Survey Is the patient deaf or have difficulty hearing?: No Does the patient have difficulty seeing, even when wearing glasses/contacts?: No Does the patient have difficulty concentrating, remembering, or making decisions?: No Does the patient have difficulty walking or climbing stairs?: No Does the patient have difficulty dressing or bathing?: No Does the patient have difficulty doing errands alone such as visiting a doctor's office or shopping?: No  Can patient draw a clock face showing 3:15 oclock, yes  Fall Risk Screen Fall Risk  08/05/2018 12/11/2016  Falls in the past year? 0 No  Follow up Falls evaluation completed -    Gait Assessment: Normal gait observed - yes  Advanced directives Does patient have a Health Care Power of Attorney? No  Does patient have a Living Will? No   Past Medical History:  Diagnosis Date  . Arthritis    right knee  . BPH (benign prostatic hypertrophy)    with urinary urgency and weak stream, followed by Luke. Bjorn PippinJohn Orr, [facility]  . Bulging lumbar disc    self reported  . DDD (degenerative disc disease), lumbar   . Elevated PSA    Luke. Annabell Orr, [facility]  . Full  dentures   . Hyperlipidemia   . Hypertension   . Impaired fasting blood sugar   . Obesity   . Obesity     Past Surgical History:  Procedure Laterality Date  . COLONOSCOPY  2015   mild diverticulosis, repeat 2025; Luke. Leone Orr  . INGUINAL HERNIA REPAIR Right 1995  . PROSTATE BIOPSY  2014   x 2; Luke. Annabell Orr    Social History   Socioeconomic History  . Marital status: Unknown    Spouse name: Not on file  . Number of children: Not on file  . Years of education: Not on file  . Highest education level: Not on file  Occupational History  . Not on file  Social Needs  . Financial resource strain: Not on file  . Food insecurity:    Worry: Not on file    Inability: Not on file  . Transportation needs:    Medical: Not on file    Non-medical: Not on file  Tobacco Use  . Smoking status: Former Smoker    Packs/day: 1.00    Years: 5.00    Pack years: 5.00    Last attempt to quit: 03/06/2003    Years since quitting: 15.4  . Smokeless tobacco: Never Used  Substance and Sexual Activity  . Alcohol use: Yes    Alcohol/week: 17.0 standard drinks    Types: 7 Glasses of wine, 10 Cans of beer per week  . Drug use: No  . Sexual activity: Not on file  Lifestyle  .  Physical activity:    Days per week: Not on file    Minutes per session: Not on file  . Stress: Not on file  Relationships  . Social connections:    Talks on phone: Not on file    Gets together: Not on file    Attends religious service: Not on file    Active member of club or organization: Not on file    Attends meetings of clubs or organizations: Not on file    Relationship status: Not on file  . Intimate partner violence:    Fear of current or ex partner: Not on file    Emotionally abused: Not on file    Physically abused: Not on file    Forced sexual activity: Not on file  Other Topics Concern  . 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  As of 08/2018    Family History  Problem Relation Age of Onset  . Heart disease Mother   . Diabetes Mother   . Hypertension Mother   . Heart disease Father   . Hypertension Sister   . Diabetes Sister   . Hypertension Brother   . Cancer Brother 50       prostate  . Heart disease Brother 5       CABG  . Hypertension Sister   . Diabetes Sister   . Hypertension Sister   . Diabetes Sister   . Hypertension Sister   . Hypertension Sister   . Hypertension Brother   . Diabetes Brother   . Colon cancer Neg Hx   . Stroke Neg Hx      Current Outpatient Medications:  .  amLODipine (NORVASC) 10 MG tablet, Take 1 tablet (10 mg total) by mouth daily., Disp: 90 tablet, Rfl: 3 .  aspirin EC 81 MG tablet, Take 1 tablet (81 mg total) by mouth daily., Disp: 90 tablet, Rfl: 3 .  lisinopril-hydrochlorothiazide (PRINZIDE,ZESTORETIC) 20-12.5 MG tablet, Take 1 tablet by mouth daily., Disp: 90 tablet, Rfl: 3 .  Misc Natural Products (COMPLETE PROSTATE HEALTH PO), Take 2 tablets by mouth daily., Disp: , Rfl:  .  rosuvastatin (CRESTOR) 20 MG tablet, Take 1 tablet (20 mg total) by mouth at bedtime., Disp: 90 tablet, Rfl: 3 .  meclizine (ANTIVERT) 25 MG tablet, Take 1 tablet (25 mg total) by mouth 2 (two) times daily. (Patient not taking: Reported on 12/03/2017), Disp: 30 tablet, Rfl: 0  Allergies  Allergen Reactions  . Morphine And Related Shortness Of Breath    History reviewed: allergies, current medications, past family history, past medical history, past social history, past surgical history and problem list  Chronic issues discussed: Hypertension-compliant with medication without complaint.  Hyperlipidemia-compliant with medication without complaint  BPH-takes over-the-counter anti-prostate medicine  Acute issues discussed: He notes in recent weeks  some difficulty getting a full breath, similar to the way he felt in the past when he smoked regularly.  He denies regular  cough or shortness of breath.  He quit smoking 5 or 6 years ago, smoked for about 20 years.  Has a 20-pack-year history.  He did see cardiology in January 2020, had an echocardiogram.  He denies chest pain, no dyspnea on exertion.  He still works full-time.  He does endorse fatigue, daytime somnolence, not restful sleep, and snores.  No witnessed apnea.  No prior sleep study.  Objective:  Biometric BP 138/84   Pulse 68   Temp 97.7 F (36.5 C) (Temporal)   Resp 16   Ht 5\' 7"  (1.702 m)   Wt 229 lb 3.2 oz (104 kg)   SpO2 97%   BMI 35.90 kg/m   Gen: wd, wn, nad HEENT: normocephalic, sclerae anicteric, TMs pearly, nares patent, no discharge or erythema, pharynx normal Oral cavity: MMM, no lesions Neck: supple, no lymphadenopathy, no thyromegaly, no masses Heart: RRR, normal S1, S2, no murmurs Lungs: CTA bilaterally, no wheezes, rhonchi, or rales Abdomen: +bs, soft, non tender, non distended, no masses, no hepatomegaly, no splenomegaly Musculoskeletal: Bilateral hands over her MCPs and fingers in general with bony arthritic changes suggestive of osteoarthritis, generalized.  Otherwise nontender, no swelling, no obvious deformity Extremities: no edema, no cyanosis, no clubbing Pulses: 2+ symmetric, upper and lower extremities, normal cap refill Neurological: alert, oriented x 3, CN2-12 intact, strength normal upper extremities and lower extremities, sensation normal throughout, DTRs 2+ throughout, no cerebellar signs, gait normal Psychiatric: normal affect, behavior normal, pleasant  GU: normal male, circ, large left inguinal hernia, reducible, otherwise testes, normal no lymphadenopathy, no mass Declines rectal exam  Assessment:   Encounter Diagnoses  Name Primary?  . Essential hypertension Yes  . Encounter for health maintenance examination in adult   . Elevated PSA   . Benign prostatic hyperplasia with lower urinary tract symptoms, symptom details unspecified   . Impaired  fasting blood sugar   . Vaccine counseling   . Obesity with serious comorbidity, unspecified classification, unspecified obesity type   . Hyperlipidemia, unspecified hyperlipidemia type   . Family history of heart disease   . Fatigue, unspecified type   . Snoring   . Daytime somnolence   . Screening for diabetes mellitus   . Screening for prostate cancer   . Screen for colon cancer   . Dyspnea, unspecified type      Plan:   A preventative services Orr was completed today.  During the course of the Orr today, we discussed and counseled about appropriate screening and preventive services.  A health risk assessment was established today that included a review of current medications, allergies, social history, family history, medical and preventative health history, biometrics, and preventative screenings to identify potential safety concerns or impairments.  A personalized plan was printed today for your records and use.   Personalized health advice and education was given today to reduce health risks and promote self management and wellness.  Information regarding end of life planning was discussed today.  Conditions/risks identified: Fatigue, daytime somnolence, possible sleep apnea Dyspnea  Chronic problems discussed today: Hypertension-continue same medication, lab  today  hyperlipidemia-continue same medicine, labs today  Dyspnea, history of tobacco use quit 5 to 6 years ago.  PFT normal today.  Fatigue, snoring, daytime somnolence-consider sleep study  Obesity-counseled on diet and exercise changes to lose weight  I reviewed his prior colonoscopy from 2015.  Sent home with stool cards for intermediate testing today  I reviewed his January 2020 echocardiogram with Luke. Rosemary Holms that showed mild LVH 58% ejection fraction, also reviewed his abdominal aortic aneurysm screen that was normal other than some mild plaque consistent with aortic atherosclerosis  Large left  inguinal hernia - advised surgery. He wants to hold off at this time.  Discussed risks.   Acute problems discussed today: none  Recommendations:  I recommend a yearly ophthalmology/optometry Orr for glaucoma screening and eye checkup  I recommended a yearly dental Orr for hygiene and  checkup  Advanced directives - discussed nature and purpose of Advanced Directives, encouraged them to complete them if they have not done so and/or encouraged them to get [place] a copy if they have done this already.  Referrals today: None  Immunizations: I recommended a yearly influenza vaccine, typically in September when the vaccine is usually available  Shingles vaccine:  I recommend you have a shingles vaccine to help prevent shingles or herpes zoster outbreak.   Please call your insurer to inquire about coverage for the Shingrix vaccine given in 2 doses.   Some insurers cover this vaccine after age 43, some cover this after age 44.  If your insurer covers this, then call to schedule appointment to have this vaccine here.  Also recommended Prevnar 13 vaccine.  He wants to consider this but declines today  Jeffery was seen today for cpe.  Diagnoses and all orders for this Orr:  Essential hypertension  Encounter for health maintenance examination in adult -     Comprehensive metabolic panel -     CBC -     Lipid panel -     PSA -     Hemoglobin A1c -     Testosterone  Elevated PSA  Benign prostatic hyperplasia with lower urinary tract symptoms, symptom details unspecified -     PSA  Impaired fasting blood sugar  Vaccine counseling  Obesity with serious comorbidity, unspecified classification, unspecified obesity type  Hyperlipidemia, unspecified hyperlipidemia type -     Lipid panel  Family history of heart disease  Fatigue, unspecified type -     Testosterone  Snoring  Daytime somnolence  Screening for diabetes mellitus -     Hemoglobin A1c  Screening for prostate  cancer -     PSA  Screen for colon cancer  Dyspnea, unspecified type -     Spirometry with graph -     DG Chest 2 View; Future     Medicare Attestation A preventative services Orr was completed today.  During the course of the Orr the patient was educated and counseled about appropriate screening and preventive services.  A health risk assessment was established with the patient that included a review of current medications, allergies, social history, family history, medical and preventative health history, biometrics, and preventative screenings to identify potential safety concerns or impairments.  A personalized plan was printed today for the patient's records and use.   Personalized health advice and education was given today to reduce health risks and promote self management and wellness.  Information regarding end of life planning was discussed today.  Kristian Covey, PA-C   08/05/2018

## 2018-08-06 ENCOUNTER — Other Ambulatory Visit: Payer: Self-pay | Admitting: Medical

## 2018-08-06 DIAGNOSIS — R0600 Dyspnea, unspecified: Secondary | ICD-10-CM | POA: Insufficient documentation

## 2018-08-06 LAB — COMPREHENSIVE METABOLIC PANEL
ALT: 25 IU/L (ref 0–44)
AST: 29 IU/L (ref 0–40)
Albumin/Globulin Ratio: 1.8 (ref 1.2–2.2)
Albumin: 4.4 g/dL (ref 3.8–4.8)
Alkaline Phosphatase: 87 IU/L (ref 39–117)
BUN/Creatinine Ratio: 15 (ref 10–24)
BUN: 14 mg/dL (ref 8–27)
Bilirubin Total: 0.5 mg/dL (ref 0.0–1.2)
CO2: 26 mmol/L (ref 20–29)
Calcium: 9.2 mg/dL (ref 8.6–10.2)
Chloride: 100 mmol/L (ref 96–106)
Creatinine, Ser: 0.94 mg/dL (ref 0.76–1.27)
GFR calc Af Amer: 98 mL/min/{1.73_m2} (ref >59)
GFR calc non Af Amer: 85 mL/min/{1.73_m2} (ref >59)
Globulin, Total: 2.4 g/dL (ref 1.5–4.5)
Glucose: 110 mg/dL — ABNORMAL HIGH (ref 65–99)
Potassium: 3.8 mmol/L (ref 3.5–5.2)
Sodium: 139 mmol/L (ref 134–144)
Total Protein: 6.8 g/dL (ref 6.0–8.5)

## 2018-08-06 LAB — CBC
Hematocrit: 42.7 % (ref 37.5–51.0)
Hemoglobin: 14.4 g/dL (ref 13.0–17.7)
MCH: 28.9 pg (ref 26.6–33.0)
MCHC: 33.7 g/dL (ref 31.5–35.7)
MCV: 86 fL (ref 79–97)
Platelets: 279 10*3/uL (ref 150–450)
RBC: 4.98 x10E6/uL (ref 4.14–5.80)
RDW: 12.8 % (ref 11.6–15.4)
WBC: 6.1 10*3/uL (ref 3.4–10.8)

## 2018-08-06 LAB — LIPID PANEL
Chol/HDL Ratio: 2.8 ratio (ref 0.0–5.0)
Cholesterol, Total: 156 mg/dL (ref 100–199)
HDL: 56 mg/dL (ref >39)
LDL Calculated: 82 mg/dL (ref 0–99)
Triglycerides: 88 mg/dL (ref 0–149)
VLDL Cholesterol Cal: 18 mg/dL (ref 5–40)

## 2018-08-06 LAB — HEMOGLOBIN A1C
Est. average glucose Bld gHb Est-mCnc: 128 mg/dL
Hgb A1c MFr Bld: 6.1 % — ABNORMAL HIGH (ref 4.8–5.6)

## 2018-08-06 LAB — PSA: Prostate Specific Ag, Serum: 14 ng/mL — ABNORMAL HIGH (ref 0.0–4.0)

## 2018-08-06 LAB — TESTOSTERONE: Testosterone: 239 ng/dL — ABNORMAL LOW (ref 264–916)

## 2018-08-25 ENCOUNTER — Other Ambulatory Visit (INDEPENDENT_AMBULATORY_CARE_PROVIDER_SITE_OTHER): Payer: Medicare Other

## 2018-08-25 ENCOUNTER — Other Ambulatory Visit: Payer: Self-pay

## 2018-08-25 DIAGNOSIS — Z1211 Encounter for screening for malignant neoplasm of colon: Secondary | ICD-10-CM | POA: Diagnosis not present

## 2018-08-25 LAB — POC HEMOCCULT BLD/STL (HOME/3-CARD/SCREEN)
Card #2 Fecal Occult Blod, POC: NEGATIVE
Card #3 Fecal Occult Blood, POC: POSITIVE
Fecal Occult Blood, POC: POSITIVE — AB

## 2018-09-03 ENCOUNTER — Telehealth: Payer: Self-pay | Admitting: Medical

## 2018-09-03 NOTE — Telephone Encounter (Signed)
Patient notified of recommendations and states that he understands  

## 2018-09-03 NOTE — Telephone Encounter (Signed)
Please call him back regarding stool + screen.   Dr. Celesta Aver office should be contacting him.   Given the + stool screen, even if the blood is just from hemorrhoids, if he is having persistent bleeding from hemorrhoids, hemorrhoidal bleeding can be treated.  You can become anemic from hemorrhoid treatment.   My father for example has had to have blood transfusions twice in his life from hemorrhoid bleeding.

## 2018-09-09 ENCOUNTER — Telehealth: Payer: Self-pay

## 2018-09-09 NOTE — Telephone Encounter (Signed)
Left message for patient to call back  

## 2018-09-09 NOTE — Telephone Encounter (Signed)
Patient has been scheduled for 8/4

## 2018-09-09 NOTE — Telephone Encounter (Signed)
-----   Message from Gatha Mayer, MD sent at 09/03/2018  1:57 PM EDT ----- Regarding: RE: question about hemoccult + stool We will contact him and offer an in office appointment to evaluate rectal bleeding and heme + stool.  Glendell Docker ----- Message ----- From: Caryl Ada Sent: 09/03/2018   7:42 AM EDT To: Gatha Mayer, MD Subject: RE: question about hemoccult + stool           I agree.  I already told him your office would contact him.  His reply to me was more dismissive that he wasn't concerned at all.   So it may be more meaningful if your office requests a visit to further eval.   We will also encourage him to have the follow up. Audelia Acton ----- Message ----- From: Gatha Mayer, MD Sent: 09/02/2018   4:04 PM EDT To: Carlena Hurl, PA-C Subject: RE: question about hemoccult + stool           He should come see me in the office for an assessment in person to decide about treating hemorrhoids vs pursuing colonoscopy.  If you agree let me know and we can call him so that he gets an in person visit  Thanks  Glendell Docker ----- Message ----- From: Caryl Ada Sent: 08/26/2018   9:36 PM EDT To: Gatha Mayer, MD Subject: question about hemoccult + stool               Hello Dr. Carlean Purl  I did 3 stool cards on our mutual patient recently and 2 of 3 were hemoccult +.   He had a colonoscopy with you 2015 showing diverticulosis.   His recent CBC normal.  He notes having some bleeding hemorrhoids occasionally.   Your thoughts please?  I wanted to reach out to you on this one.  Audelia Acton

## 2018-09-19 ENCOUNTER — Ambulatory Visit: Payer: Self-pay | Admitting: Cardiology

## 2018-09-22 ENCOUNTER — Encounter: Payer: Self-pay | Admitting: Cardiology

## 2018-09-23 NOTE — Progress Notes (Deleted)
Virtual Visit via Video Note   Subjective:   Luke Orr, male    DOB: 06-29-1950, 68 y.o.   MRN: [id number]   I connected with the patient on 09/24/18 by a video enabled telemedicine application and verified that I am speaking with the correct person using two identifiers.     I discussed the limitations of evaluation and management by telemedicine and the availability of in person appointments. The patient expressed understanding and agreed to proceed.   This visit type was conducted due to national recommendations for restrictions regarding the COVID-19 Pandemic (e.g. social distancing).  This format is felt to be most appropriate for this patient at this time.  All issues noted in this document were discussed and addressed.  No physical exam was performed (except for noted visual exam findings with Tele health visits).  The patient has consented to conduct a Tele health visit and understands insurance will be billed.     Chief complaint:  ***  *** HPI  68 y.o. African American male with uncontrolled hypertension, prediabetes, hyperlipidemia, h/o tobacco abuse, family h/o CAD.  *** Past Medical History:  Diagnosis Date  . Arthritis    right knee  . BPH (benign prostatic hypertrophy)    with urinary urgency and weak stream, followed by Dr. Irine Seal, [facility]  . Bulging lumbar disc    self reported  . DDD (degenerative disc disease), lumbar   . Elevated PSA    Dr. Jeffie Pollock, [facility]  . Full dentures   . Hyperlipidemia   . Hypertension   . Impaired fasting blood sugar   . Obesity   . Obesity     *** Past Surgical History:  Procedure Laterality Date  . COLONOSCOPY  2015   mild diverticulosis, repeat 2025; Dr. Carlean Purl  . INGUINAL HERNIA REPAIR Right 1995  . PROSTATE BIOPSY  2014   x 2; Dr. Jeffie Pollock    *** Social History   Socioeconomic History  . Marital status: Married    Spouse name: Not on file  . Number of children: 3  . Years of  education: Not on file  . Highest education level: Not on file  Occupational History  . Not on file  Social Needs  . Financial resource strain: Not on file  . Food insecurity    Worry: Not on file    Inability: Not on file  . Transportation needs    Medical: Not on file    Non-medical: Not on file  Tobacco Use  . Smoking status: Former Smoker    Packs/day: 1.00    Years: 5.00    Pack years: 5.00    Quit date: 03/06/2003    Years since quitting: 15.5  . Smokeless tobacco: Never Used  Substance and Sexual Activity  . Alcohol use: Yes    Alcohol/week: 17.0 standard drinks    Types: 7 Glasses of wine, 10 Cans of beer per week  . Drug use: No  . Sexual activity: Not on file  Lifestyle  . Physical activity    Days per week: Not on file    Minutes per session: Not on file  . Stress: Not on file  Relationships  . Social Herbalist on phone: Not on file    Gets together: Not on file    Attends religious service: Not on file    Active member of club or organization: Not on file    Attends meetings of clubs or organizations: Not  on file    Relationship status: Not on file  . Intimate partner violence    Fear of current or ex partner: Not on file    Emotionally abused: Not on file    Physically abused: Not on file    Forced sexual activity: Not on file  Other Topics Concern  . 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  As of 08/2018    *** Family History  Problem Relation Age of Onset  . Heart disease Mother   . Diabetes Mother   . Hypertension Mother   . Heart disease Father   . Hypertension Sister   . Diabetes Sister   . Hypertension Brother   . Cancer Brother 50       prostate  . Heart disease Brother 23       CABG  . Hypertension Sister   . Diabetes Sister   . Hypertension Sister   . Diabetes Sister   . Hypertension Sister   . Hypertension Sister   .  Hypertension Brother   . Diabetes Brother   . Colon cancer Neg Hx   . Stroke Neg Hx     *** Current Outpatient Medications on File Prior to Visit  Medication Sig Dispense Refill  . amLODipine (NORVASC) 10 MG tablet Take 1 tablet (10 mg total) by mouth daily. 90 tablet 3  . aspirin EC 81 MG tablet Take 1 tablet (81 mg total) by mouth daily. 90 tablet 3  . lisinopril-hydrochlorothiazide (PRINZIDE,ZESTORETIC) 20-12.5 MG tablet Take 1 tablet by mouth daily. 90 tablet 3  . rosuvastatin (CRESTOR) 20 MG tablet Take 1 tablet (20 mg total) by mouth at bedtime. 90 tablet 3   No current facility-administered medications on file prior to visit.     Cardiovascular studies:  EKG 02/05/2018: Sinus rhythm 65 bpm. Normal EKG.   Echocardiogram 03/12/2018: Mod LVH. EF 58%. Normal wall motion. Normal diastolic filling. LA cavity mildly dilated.  Mild MAC. Trace MR  Recent labs: 03/12/2018: Chol 190, TG 187, HDL 56, LDL 97  12/03/2017:  Glucose 109. BUN/Cr 10/0.85. eGFR normal. Na/K 142/4.0 H/H 14/43. MCV 89. Platelets 316. Chol 191, TG 80, HDL 57, LDL 116  *** Review of Systems  Constitution: Negative for decreased appetite, malaise/fatigue, weight gain and weight loss.  HENT: Negative for congestion.   Eyes: Negative for visual disturbance.  Cardiovascular: Negative for chest pain, dyspnea on exertion, leg swelling, palpitations and syncope.  Respiratory: Negative for cough.   Endocrine: Negative for cold intolerance.  Hematologic/Lymphatic: Does not bruise/bleed easily.  Skin: Negative for itching and rash.  Musculoskeletal: Negative for myalgias.  Gastrointestinal: Negative for abdominal pain, nausea and vomiting.  Genitourinary: Negative for dysuria.  Neurological: Negative for dizziness and weakness.  Psychiatric/Behavioral: The patient is not nervous/anxious.   All other systems reviewed and are negative.       *** There were no vitals filed for this visit. (Measured by  the patient using a home BP monitor)  There is no height or weight on file to calculate BMI. There were no vitals filed for this visit.  *** Observation/findings during video visit   Objective:    Physical Exam  Constitutional: He is oriented to person, place, and time. He appears well-developed and well-nourished. No distress.  Pulmonary/Chest: Effort normal.  Neurological: He is alert and oriented to person, place, and time.  Psychiatric: He has  a normal mood and affect.  Nursing note and vitals reviewed.         Assessment & Recommendations:   ***  ***   Lela Murfin Esther Hardy, MD [facility]. [facility] Pager: [phone] Office: [phone] If no answer Cell [phone]

## 2018-09-24 ENCOUNTER — Ambulatory Visit: Payer: Self-pay | Admitting: Cardiology

## 2018-09-24 ENCOUNTER — Other Ambulatory Visit: Payer: Self-pay

## 2018-10-07 ENCOUNTER — Ambulatory Visit: Payer: Self-pay | Admitting: Internal Medicine

## 2018-10-08 ENCOUNTER — Telehealth: Payer: Self-pay

## 2018-10-08 NOTE — Telephone Encounter (Signed)
I called Luke Orr to r/s his appointment, he no-showed yesterday. He said he forgot and he said he is not having any more bleeding or hemorrhoid issues and did not want to r/s. I gave him our # to call us back if he decides to r/s.

## 2018-10-30 ENCOUNTER — Ambulatory Visit (INDEPENDENT_AMBULATORY_CARE_PROVIDER_SITE_OTHER): Payer: Medicare Other | Admitting: Medical

## 2018-10-30 ENCOUNTER — Encounter: Payer: Self-pay | Admitting: Medical

## 2018-10-30 ENCOUNTER — Other Ambulatory Visit: Payer: Self-pay

## 2018-10-30 VITALS — BP 140/98 | HR 68 | Temp 98.2°F | Ht 67.0 in | Wt 222.6 lb

## 2018-10-30 DIAGNOSIS — H9202 Otalgia, left ear: Secondary | ICD-10-CM

## 2018-10-30 DIAGNOSIS — T162XXA Foreign body in left ear, initial encounter: Secondary | ICD-10-CM | POA: Diagnosis not present

## 2018-10-30 DIAGNOSIS — H9190 Unspecified hearing loss, unspecified ear: Secondary | ICD-10-CM

## 2018-10-31 ENCOUNTER — Telehealth: Payer: Self-pay | Admitting: Medical

## 2018-10-31 NOTE — Telephone Encounter (Signed)
Informed patient of results. He stated he's not having any problems hearing but he will call if he wants a referral to Audiology.

## 2018-10-31 NOTE — Telephone Encounter (Signed)
Let him know yesterday he did have some hearing loss noted on the audiometry.  He may want to consider a baseline consult with audiologist or hearing clinic particularly if he does have problems hearing.  Please find out and if needed refer to audiology.  Also his blood pressure was elevated.  Make sure he is taking his medications.  Have him check his blood pressure at least 2 days/week and write down his blood pressure readings.  If his readings are not regularly under 130/80 over the next month then we need to discuss and possibly modify his medications

## 2018-10-31 NOTE — Progress Notes (Signed)
Subjective:  Luke Orr is a 68 y.o. male who presents for Chief Complaint  Patient presents with  . Ear Problem    bug in left ear-put vegestable oil in ear      Here for complaint of insect in left ear.  He heard a buzzing around his left ear few days ago, felt like an insect was inside his ear canal.  His wife ended up putting vegetable oil in the ear and the buzzing and movement stopped.  He would like the insect removed.  Otherwise in normal state of health.  No other aggravating or relieving factors.    No other c/o.  The following portions of the patient's history were reviewed and updated as appropriate: allergies, current medications, past family history, past medical history, past social history, past surgical history and problem list.  ROS Otherwise as in subjective above  Objective: BP (!) 140/98   Pulse 68   Temp 98.2 F (36.8 C) (Oral)   Ht 5\' 7"  (1.702 m)   Wt 222 lb 9.6 oz (101 kg)   SpO2 95%   BMI 34.86 kg/m   General appearance: alert, no distress, well developed, well nourished Left TM normal, left ear canal normal, no sign of insect or foreign body.  Right ear normal as well Hearing screen performed    Assessment: Encounter Diagnoses  Name Primary?  . Ear discomfort, left Yes  . Foreign body of left ear, initial encounter      Plan: Hearing screen shows some hearing loss as discussed.  Reassured there was no foreign body.  I did a light irrigation of the left ear with warm water just to confirm nothing present in the ear.   reassured  [name] was seen today for ear problem.  Diagnoses and all orders for this visit:  Ear discomfort, left  Foreign body of left ear, initial encounter    Follow up: prn

## 2018-12-31 ENCOUNTER — Other Ambulatory Visit: Payer: Self-pay | Admitting: Medical

## 2018-12-31 DIAGNOSIS — I10 Essential (primary) hypertension: Secondary | ICD-10-CM

## 2019-03-26 ENCOUNTER — Other Ambulatory Visit: Payer: Self-pay | Admitting: Medical

## 2019-03-26 DIAGNOSIS — I10 Essential (primary) hypertension: Secondary | ICD-10-CM

## 2019-06-25 ENCOUNTER — Other Ambulatory Visit: Payer: Self-pay | Admitting: Medical

## 2019-06-25 DIAGNOSIS — I10 Essential (primary) hypertension: Secondary | ICD-10-CM

## 2019-07-23 ENCOUNTER — Other Ambulatory Visit: Payer: Self-pay | Admitting: Medical

## 2019-07-23 DIAGNOSIS — I10 Essential (primary) hypertension: Secondary | ICD-10-CM

## 2019-07-23 NOTE — Telephone Encounter (Signed)
Lmom for patient to call back to schedule an appointment. Patient also has been sent several messages via mychart. Nothing scheduled at this time.

## 2019-07-23 NOTE — Telephone Encounter (Signed)
Sent patient messages on mychart about needing an appointment. Called patient and lmom to call and schedule an appointment.

## 2019-07-29 ENCOUNTER — Other Ambulatory Visit: Payer: Self-pay | Admitting: Medical

## 2019-07-29 DIAGNOSIS — I10 Essential (primary) hypertension: Secondary | ICD-10-CM

## 2019-07-29 NOTE — Telephone Encounter (Signed)
Lmom for patient to call the office and schedule an appointment. Have sent several calls and messages via mychart. Please advise.

## 2019-08-23 ENCOUNTER — Other Ambulatory Visit: Payer: Self-pay | Admitting: Medical

## 2019-08-23 DIAGNOSIS — I10 Essential (primary) hypertension: Secondary | ICD-10-CM

## 2019-08-24 NOTE — Telephone Encounter (Signed)
We have called and lmom for patient to call and schedule his appointment. We have also tried reaching ut via mychart. No appointment scheduled yet.

## 2019-08-31 ENCOUNTER — Other Ambulatory Visit: Payer: Self-pay | Admitting: Medical

## 2019-08-31 ENCOUNTER — Telehealth: Payer: Self-pay | Admitting: Medical

## 2019-08-31 ENCOUNTER — Other Ambulatory Visit: Payer: Self-pay

## 2019-08-31 DIAGNOSIS — I10 Essential (primary) hypertension: Secondary | ICD-10-CM

## 2019-08-31 MED ORDER — ASPIRIN EC 81 MG PO TBEC: 81.0000 mg | DELAYED_RELEASE_TABLET | Freq: Every day | ORAL | 0 refills | Status: DC

## 2019-08-31 MED ORDER — LISINOPRIL-HYDROCHLOROTHIAZIDE 20-12.5 MG PO TABS: 1.0000 | ORAL_TABLET | Freq: Every day | ORAL | 0 refills | Status: DC

## 2019-08-31 MED ORDER — ROSUVASTATIN CALCIUM 20 MG PO TABS: ORAL_TABLET | ORAL | 0 refills | Status: DC

## 2019-08-31 MED ORDER — AMLODIPINE BESYLATE 10 MG PO TABS: 10.0000 mg | ORAL_TABLET | Freq: Every day | ORAL | 0 refills | Status: DC

## 2019-08-31 NOTE — Telephone Encounter (Signed)
Done, make sure they follow-up in July as scheduled fasting

## 2019-08-31 NOTE — Telephone Encounter (Signed)
Medication has been sent to the pharmacy. 

## 2019-08-31 NOTE — Telephone Encounter (Signed)
Pts wife called back and said pt also needs refill on the Amlodipine and Lisinopril- hydrochlorothiazide

## 2019-08-31 NOTE — Telephone Encounter (Signed)
Pt's wife called and made pt an appt for July for [facility]. Pt needs refills for rosuvastatin. plesae send to [facility] [address]

## 2019-09-22 ENCOUNTER — Other Ambulatory Visit: Payer: Self-pay | Admitting: Medical

## 2019-09-22 DIAGNOSIS — I10 Essential (primary) hypertension: Secondary | ICD-10-CM

## 2019-09-24 ENCOUNTER — Ambulatory Visit (INDEPENDENT_AMBULATORY_CARE_PROVIDER_SITE_OTHER): Payer: Medicare Other | Admitting: Medical

## 2019-09-24 ENCOUNTER — Encounter: Payer: Self-pay | Admitting: Medical

## 2019-09-24 ENCOUNTER — Other Ambulatory Visit: Payer: Self-pay

## 2019-09-24 VITALS — BP 132/80 | HR 68 | Ht 67.0 in | Wt 221.4 lb

## 2019-09-24 DIAGNOSIS — R0683 Snoring: Secondary | ICD-10-CM

## 2019-09-24 DIAGNOSIS — R972 Elevated prostate specific antigen [PSA]: Secondary | ICD-10-CM

## 2019-09-24 DIAGNOSIS — M5136 Other intervertebral disc degeneration, lumbar region: Secondary | ICD-10-CM

## 2019-09-24 DIAGNOSIS — N401 Enlarged prostate with lower urinary tract symptoms: Secondary | ICD-10-CM

## 2019-09-24 DIAGNOSIS — Z131 Encounter for screening for diabetes mellitus: Secondary | ICD-10-CM

## 2019-09-24 DIAGNOSIS — R7301 Impaired fasting glucose: Secondary | ICD-10-CM

## 2019-09-24 DIAGNOSIS — Z7189 Other specified counseling: Secondary | ICD-10-CM | POA: Insufficient documentation

## 2019-09-24 DIAGNOSIS — R5383 Other fatigue: Secondary | ICD-10-CM

## 2019-09-24 DIAGNOSIS — Z0000 Encounter for general adult medical examination without abnormal findings: Secondary | ICD-10-CM | POA: Diagnosis not present

## 2019-09-24 DIAGNOSIS — Z8249 Family history of ischemic heart disease and other diseases of the circulatory system: Secondary | ICD-10-CM

## 2019-09-24 DIAGNOSIS — I700 Atherosclerosis of aorta: Secondary | ICD-10-CM

## 2019-09-24 DIAGNOSIS — Z23 Encounter for immunization: Secondary | ICD-10-CM

## 2019-09-24 DIAGNOSIS — I10 Essential (primary) hypertension: Secondary | ICD-10-CM

## 2019-09-24 DIAGNOSIS — Z7185 Encounter for immunization safety counseling: Secondary | ICD-10-CM

## 2019-09-24 DIAGNOSIS — M51369 Other intervertebral disc degeneration, lumbar region without mention of lumbar back pain or lower extremity pain: Secondary | ICD-10-CM

## 2019-09-24 DIAGNOSIS — E669 Obesity, unspecified: Secondary | ICD-10-CM

## 2019-09-24 DIAGNOSIS — R1310 Dysphagia, unspecified: Secondary | ICD-10-CM | POA: Insufficient documentation

## 2019-09-24 DIAGNOSIS — E785 Hyperlipidemia, unspecified: Secondary | ICD-10-CM

## 2019-09-24 NOTE — Progress Notes (Signed)
Subjective:    Luke Orr is a 69 y.o. male who presents for Preventative Services visit and chronic medical problems/med check visit.    Primary Care Provider Izella Ybanez, Kermit Baloavid S, PA-C here for primary care  Current Health Care Team:  Dentist, n/a   Eye doctor, n/a   Dr. Stan Headarl Gessner, GI  [facility], Dr. Morton AmyManish Patwardan, cardiology  Medical Services you may have received from other than [facility] providers in the past year (date may be approximate) none  Exercise Current exercise habits: The patient does not participate in regular exercise at present.   Nutrition/Diet Current diet: well balanced  Depression Screen [facility]  Decreased Interest 0  Down, Depressed, Hopeless 0  PHQ - 2 Score 0    Activities of Daily Living Screen/Functional Status Survey Is the patient deaf or have difficulty hearing?: No Does the patient have difficulty seeing, even when wearing glasses/contacts?: No Does the patient have difficulty concentrating, remembering, or making decisions?: No Does the patient have difficulty walking or climbing stairs?: No Does the patient have difficulty dressing or bathing?: No Does the patient have difficulty doing errands alone such as visiting a doctor's office or shopping?: No  Can patient draw a clock face showing 3:15 oclock, yes  Fall Risk Screen Fall Risk  09/24/2019 08/05/2018 12/11/2016  Falls in the past year? 0 0 No  Follow up - Falls evaluation completed -    Gait Assessment: Normal gait observed yes  Advanced directives Does patient have a Health Care Power of Attorney? No Does patient have a Living Will? No  Past Medical History:  Diagnosis Date  . Abnormal abdominal ultrasound 03/2018   no AAA, atherosclerosis present  . Arthritis    right knee  . BPH (benign prostatic hypertrophy)    with urinary urgency and weak stream, followed by Dr. Bjorn PippinJohn Wrenn, [facility]  . Bulging lumbar disc     self reported  . DDD (degenerative disc disease), lumbar   . Elevated PSA    Dr. Annabell HowellsWrenn, [facility]  . Full dentures   . H/O echocardiogram 03/2018   [facility], mod concetric LVH, EF 58%. mild left atrial enlargement  . Hyperlipidemia   . Hypertension   . Impaired fasting blood sugar   . Obesity   . Obesity     Past Surgical History:  Procedure Laterality Date  . COLONOSCOPY  2015   mild diverticulosis, repeat 2025; Dr. Leone PayorGessner  . INGUINAL HERNIA REPAIR Right 1995  . PROSTATE BIOPSY  2014   x 2; Dr. Annabell HowellsWrenn     Family History  Problem Relation Age of Onset  . Heart disease Mother   . Diabetes Mother   . Hypertension Mother   . Heart disease Father   . Hypertension Sister   . Diabetes Sister   . Hypertension Brother   . Cancer Brother 50       prostate  . Heart disease Brother [id number]       CABG, died of CHF  . Hypertension Sister   . Diabetes Sister   . Hypertension Sister   . Diabetes Sister   . Hypertension Sister   . Hypertension Sister   . Aneurysm Sister        brain  . Hypertension Brother   . Diabetes Brother   . Colon cancer Neg Hx   . Stroke Neg Hx      Current Outpatient Medications:  .  amLODipine (NORVASC) 10 MG tablet, TAKE 1  TABLET BY MOUTH EVERY DAY, Disp: 30 tablet, Rfl: 0 .  aspirin EC 81 MG tablet, Take 1 tablet (81 mg total) by mouth daily., Disp: 90 tablet, Rfl: 0 .  lisinopril-hydrochlorothiazide (ZESTORETIC) 20-12.5 MG tablet, TAKE 1 TABLET BY MOUTH EVERY DAY, Disp: 30 tablet, Rfl: 0 .  rosuvastatin (CRESTOR) 20 MG tablet, TAKE 1 TABLET BY MOUTH EVERYDAY AT BEDTIME, Disp: 30 tablet, Rfl: 0  Allergies  Allergen Reactions  . Morphine And Related Shortness Of Breath    History reviewed: allergies, current medications, past family history, past medical history, past social history, past surgical history and problem list  Chronic issues discussed: He is compliant with blood pressure and cholesterol medication  Acute  issues discussed: He notes no recent month or so feels like food does not always go down with solid food.  He does not vomit up any food but just feels like there is a delay in transit by the time the food reaches his throat  Objective:      Biometrics BP (!) 132/80   Pulse 68   Ht 5\' 7"  (1.702 m)   Wt (!) 221 lb 6.4 oz (100.4 kg)   SpO2 97%   BMI 34.68 kg/m   Cognitive Testing  Alert? Yes  Normal Appearance?Yes  Oriented to person? Yes  Place? Yes   Time? Yes  Recall of three objects?  Yes  Can perform simple calculations? Yes  Displays appropriate judgment?Yes  Can read the correct time from a watch face?Yes  General appearance: alert, no distress, WD/WN, African American male  Nutritional Status: Inadequate calore intake? no Loss of muscle mass? no Loss of fat beneath skin? no Localized or general edema? no Diminished functional status? no  Other pertinent exam: Neck: supple, no lymphadenopathy, no thyromegaly, no masses, no bruits Heart: RRR, normal S1, S2, no murmurs Lungs: CTA bilaterally, no wheezes, rhonchi, or rales Abdomen: +bs, soft, non tender, non distended, no masses, no hepatomegaly, no splenomegaly Musculoskeletal: nontender, no swelling, no obvious deformity Extremities: no edema, no cyanosis, no clubbing Pulses: 2+ symmetric, upper and lower extremities, normal cap refill Neurological: alert, oriented x 3, CN2-12 intact, strength normal upper extremities and lower extremities, sensation normal throughout, DTRs 2+ throughout, no cerebellar signs, gait normal Psychiatric: normal affect, behavior normal, pleasant  GU/DRE - deferred/declined    Assessment:   Encounter Diagnoses  Name Primary?  . Encounter for health maintenance examination in adult Yes  . Medicare annual wellness visit, subsequent   . Family history of heart disease   . Benign prostatic hyperplasia with lower urinary tract symptoms, symptom details unspecified   . Lumbar  degenerative disc disease   . Essential hypertension   . Impaired fasting blood sugar   . Elevated PSA   . Fatigue, unspecified type   . Hyperlipidemia, unspecified hyperlipidemia type   . Obesity with serious comorbidity, unspecified classification, unspecified obesity type   . Screening for diabetes mellitus   . Snoring   . Vaccine counseling   . Dysphagia, unspecified type   . Need for pneumococcal vaccination   . Advanced directives, counseling/discussion   . Atherosclerosis of aorta (HCC)      Plan:   A preventative services visit was completed today.  During the course of the visit today, we discussed and counseled about appropriate screening and preventive services.  A health risk assessment was established today that included a review of current medications, allergies, social history, family history, medical and preventative health history, biometrics, and preventative screenings  to identify potential safety concerns or impairments.  A personalized plan was printed today for your records and use.   Personalized health advice and education was given today to reduce health risks and promote self management and wellness.  Information regarding end of life planning was discussed today.  Vaccines: He is up-to-date on tetanus and pneumococcal 23 vaccine He recently had his Covid vaccine, and he will get [place] a copy of his Covid card Counseled on Shingrix, he will consider Counseled on the pneumococcal vaccine.  Vaccine information sheet given.  Pneumococcal vaccine Prevnar 13 given after consent obtained.  Cancer screening: PSA today-we will send copy to urology who is seeing him for elevated PSA and BPH  Colonoscopy up-to-date however he had positive stool cards last year but he attributes that to a hemorrhoid that he noticed around the time he did this test  We discussed routine skin surveillance   Specific issues: Hypertension-continue current  medication  Hyperlipidemia-continue current medication  Dysphagia, trouble swallowing-I advised swallow study.  He will consider  Impaired glucose-labs today  Obesity-counseled on need to lose weight through healthy diet and exercise  I reviewed his January 2020 cardiology consult with Dr. Truett Mainland.  He had an echocardiogram performed January 2020 that showed moderate concentric hypertrophy of left ventricle, normal global wall motion normal diastolic filling ejection fraction 58% left atrium mildly dilated trace mitral regurgitation.  He also had abdominal aortic duplex ultrasound showing diffuse mild calcific plaque in the aorta but no aneurysm.  He was advised to continue primary prevention   Other screening and Recommendations:  I recommend a yearly ophthalmology/optometry visit for glaucoma screening and eye checkup  I recommended a yearly dental visit for hygiene and checkup  Advanced directives - discussed nature and purpose of Advanced Directives, encouraged them to complete them if they have not done so and/or encouraged them to get [place] a copy if they have done this already.   Referrals today: Consider swallow study   Marion was seen today for medicare wellness.  Diagnoses and all orders for this visit:  Encounter for health maintenance examination in adult -     Comprehensive metabolic panel -     CBC with Differential/Platelet -     Lipid panel -     PSA -     Hemoglobin A1c  Medicare annual wellness visit, subsequent  Family history of heart disease  Benign prostatic hyperplasia with lower urinary tract symptoms, symptom details unspecified -     PSA  Lumbar degenerative disc disease  Essential hypertension -     Comprehensive metabolic panel  Impaired fasting blood sugar -     Hemoglobin A1c  Elevated PSA -     PSA  Fatigue, unspecified type  Hyperlipidemia, unspecified hyperlipidemia type -     Comprehensive metabolic panel -     Lipid  panel  Obesity with serious comorbidity, unspecified classification, unspecified obesity type  Screening for diabetes mellitus -     Hemoglobin A1c  Snoring  Vaccine counseling -     Pneumococcal conjugate vaccine 13-valent  Dysphagia, unspecified type  Need for pneumococcal vaccination -     Pneumococcal conjugate vaccine 13-valent  Advanced directives, counseling/discussion  Atherosclerosis of aorta (HCC)      Medicare Attestation A preventative services visit was completed today.  During the course of the visit the patient was educated and counseled about appropriate screening and preventive services.  A health risk assessment was established with the patient that included  a review of current medications, allergies, social history, family history, medical and preventative health history, biometrics, and preventative screenings to identify potential safety concerns or impairments.  A personalized plan was printed today for the patient's records and use.   Personalized health advice and education was given today to reduce health risks and promote self management and wellness.  Information regarding end of life planning was discussed today.  Kristian Covey, PA-C   09/24/2019

## 2019-09-25 LAB — LIPID PANEL
Chol/HDL Ratio: 3.2 ratio (ref 0.0–5.0)
Cholesterol, Total: 171 mg/dL (ref 100–199)
HDL: 54 mg/dL (ref >39)
LDL Chol Calc (NIH): 102 mg/dL — ABNORMAL HIGH (ref 0–99)
Triglycerides: 83 mg/dL (ref 0–149)
VLDL Cholesterol Cal: 15 mg/dL (ref 5–40)

## 2019-09-25 LAB — CBC WITH DIFFERENTIAL/PLATELET
Basophils Absolute: 0.1 10*3/uL (ref 0.0–0.2)
Basos: 1 %
EOS (ABSOLUTE): 0.2 10*3/uL (ref 0.0–0.4)
Eos: 3 %
Hematocrit: 44.4 % (ref 37.5–51.0)
Hemoglobin: 15.1 g/dL (ref 13.0–17.7)
Immature Grans (Abs): 0 10*3/uL (ref 0.0–0.1)
Immature Granulocytes: 0 %
Lymphocytes Absolute: 2.1 10*3/uL (ref 0.7–3.1)
Lymphs: 29 %
MCH: 30 pg (ref 26.6–33.0)
MCHC: 34 g/dL (ref 31.5–35.7)
MCV: 88 fL (ref 79–97)
Monocytes Absolute: 0.7 10*3/uL (ref 0.1–0.9)
Monocytes: 10 %
Neutrophils Absolute: 4 10*3/uL (ref 1.4–7.0)
Neutrophils: 57 %
Platelets: 295 10*3/uL (ref 150–450)
RBC: 5.04 x10E6/uL (ref 4.14–5.80)
RDW: 13.1 % (ref 11.6–15.4)
WBC: 7 10*3/uL (ref 3.4–10.8)

## 2019-09-25 LAB — COMPREHENSIVE METABOLIC PANEL
ALT: 17 IU/L (ref 0–44)
AST: 26 IU/L (ref 0–40)
Albumin/Globulin Ratio: 1.6 (ref 1.2–2.2)
Albumin: 4.3 g/dL (ref 3.8–4.8)
Alkaline Phosphatase: 89 IU/L (ref 48–121)
BUN/Creatinine Ratio: 15 (ref 10–24)
BUN: 14 mg/dL (ref 8–27)
Bilirubin Total: 0.6 mg/dL (ref 0.0–1.2)
CO2: 26 mmol/L (ref 20–29)
Calcium: 9.4 mg/dL (ref 8.6–10.2)
Chloride: 100 mmol/L (ref 96–106)
Creatinine, Ser: 0.91 mg/dL (ref 0.76–1.27)
GFR calc Af Amer: 101 mL/min/{1.73_m2} (ref >59)
GFR calc non Af Amer: 88 mL/min/{1.73_m2} (ref >59)
Globulin, Total: 2.7 g/dL (ref 1.5–4.5)
Glucose: 108 mg/dL — ABNORMAL HIGH (ref 65–99)
Potassium: 4.2 mmol/L (ref 3.5–5.2)
Sodium: 140 mmol/L (ref 134–144)
Total Protein: 7 g/dL (ref 6.0–8.5)

## 2019-09-25 LAB — PSA: Prostate Specific Ag, Serum: 13.4 ng/mL — ABNORMAL HIGH (ref 0.0–4.0)

## 2019-09-25 LAB — HEMOGLOBIN A1C
Est. average glucose Bld gHb Est-mCnc: 134 mg/dL
Hgb A1c MFr Bld: 6.3 % — ABNORMAL HIGH (ref 4.8–5.6)

## 2019-09-28 ENCOUNTER — Other Ambulatory Visit: Payer: Self-pay | Admitting: Medical

## 2019-09-28 DIAGNOSIS — I10 Essential (primary) hypertension: Secondary | ICD-10-CM

## 2019-09-28 MED ORDER — AMLODIPINE BESYLATE 10 MG PO TABS: 10.0000 mg | ORAL_TABLET | Freq: Every day | ORAL | 3 refills | Status: DC

## 2019-09-28 MED ORDER — LISINOPRIL-HYDROCHLOROTHIAZIDE 20-12.5 MG PO TABS: 1.0000 | ORAL_TABLET | Freq: Every day | ORAL | 3 refills | Status: DC

## 2019-09-28 MED ORDER — ASPIRIN EC 81 MG PO TBEC: 81.0000 mg | DELAYED_RELEASE_TABLET | Freq: Every day | ORAL | 3 refills | Status: DC

## 2019-09-28 MED ORDER — ROSUVASTATIN CALCIUM 20 MG PO TABS: 20.0000 mg | ORAL_TABLET | Freq: Every day | ORAL | 3 refills | Status: DC

## 2019-10-26 ENCOUNTER — Other Ambulatory Visit: Payer: Self-pay | Admitting: Urology

## 2019-10-26 DIAGNOSIS — R972 Elevated prostate specific antigen [PSA]: Secondary | ICD-10-CM

## 2019-11-03 ENCOUNTER — Ambulatory Visit (INDEPENDENT_AMBULATORY_CARE_PROVIDER_SITE_OTHER): Payer: Medicare Other | Admitting: Medical

## 2019-11-03 ENCOUNTER — Encounter: Payer: Self-pay | Admitting: Medical

## 2019-11-03 ENCOUNTER — Other Ambulatory Visit: Payer: Self-pay

## 2019-11-03 ENCOUNTER — Telehealth: Payer: Self-pay | Admitting: Medical

## 2019-11-03 VITALS — BP 150/88 | HR 70 | Ht 67.0 in | Wt 228.0 lb

## 2019-11-03 DIAGNOSIS — I700 Atherosclerosis of aorta: Secondary | ICD-10-CM

## 2019-11-03 DIAGNOSIS — L03115 Cellulitis of right lower limb: Secondary | ICD-10-CM

## 2019-11-03 DIAGNOSIS — I10 Essential (primary) hypertension: Secondary | ICD-10-CM

## 2019-11-03 DIAGNOSIS — L608 Other nail disorders: Secondary | ICD-10-CM | POA: Insufficient documentation

## 2019-11-03 DIAGNOSIS — L03011 Cellulitis of right finger: Secondary | ICD-10-CM | POA: Insufficient documentation

## 2019-11-03 DIAGNOSIS — S8991XA Unspecified injury of right lower leg, initial encounter: Secondary | ICD-10-CM | POA: Diagnosis not present

## 2019-11-03 DIAGNOSIS — M79604 Pain in right leg: Secondary | ICD-10-CM | POA: Diagnosis not present

## 2019-11-03 DIAGNOSIS — E785 Hyperlipidemia, unspecified: Secondary | ICD-10-CM

## 2019-11-03 MED ORDER — CEPHALEXIN 500 MG PO CAPS
500.0000 mg | ORAL_CAPSULE | Freq: Three times a day (TID) | ORAL | 0 refills | Status: DC
Start: 2019-11-03 — End: 2019-11-18

## 2019-11-03 MED ORDER — METOPROLOL SUCCINATE ER 25 MG PO TB24
25.0000 mg | ORAL_TABLET | Freq: Every day | ORAL | 1 refills | Status: DC
Start: 2019-11-03 — End: 2020-12-13

## 2019-11-03 MED ORDER — IBUPROFEN 600 MG PO TABS
600.0000 mg | ORAL_TABLET | Freq: Three times a day (TID) | ORAL | 0 refills | Status: DC | PRN
Start: 2019-11-03 — End: 2020-11-03

## 2019-11-03 NOTE — Progress Notes (Signed)
Subjective:  Luke Orr is a 69 y.o. male who presents for Chief Complaint  Patient presents with  . Leg Swelling    right leg and discolored-bumped on table      Here for multiple concerns  He has a history of hypertension, hyperlipidemia, BPH, atherosclerosis, obesity.  He notes last week on August 25 he was in his garage and he was turning moving around and accidentally hit his right lower leg on an aluminum table.   He did have a break in the skin with a wound.  Over the course of the last few days the he notes some mild redness, swelling, worse pain.  No fever no body aches or chills, no nausea or vomiting.  The majority of the lower leg is swollen at this point.  No history of DVT.  He notes that his right ring finger has been getting nailbed infections for the last few months.  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  He wonders if he has something about of the skin.  He did damage the fingernail in the past almost ripping it off.  He notes compliance with his medicines although his blood pressure is not at goal.  He has not taken his blood pressure pill today.  No other aggravating or relieving factors.    No other c/o.  Past Medical History:  Diagnosis Date  . Abnormal abdominal ultrasound 03/2018   no AAA, atherosclerosis present  . Arthritis    right knee  . BPH (benign prostatic hypertrophy)    with urinary urgency and weak stream, followed by Dr. Bjorn Orr, [facility]  . Bulging lumbar disc    self reported  . DDD (degenerative disc disease), lumbar   . Elevated PSA    Dr. Annabell Orr, [facility]  . Full dentures   . H/O echocardiogram 03/2018   [facility], mod concetric LVH, EF 58%. mild left atrial enlargement  . Hyperlipidemia   . Hypertension   . Impaired fasting blood sugar   . Obesity   . Obesity     Current Outpatient Medications on File Prior to Visit  Medication Sig Dispense Refill  . amLODipine  (NORVASC) 10 MG tablet Take 1 tablet (10 mg total) by mouth daily. 90 tablet 3  . aspirin EC 81 MG tablet Take 1 tablet (81 mg total) by mouth daily. 90 tablet 3  . lisinopril-hydrochlorothiazide (ZESTORETIC) 20-12.5 MG tablet Take 1 tablet by mouth daily. 90 tablet 3  . rosuvastatin (CRESTOR) 20 MG tablet Take 1 tablet (20 mg total) by mouth daily. 90 tablet 3   No current facility-administered medications on file prior to visit.   Past Surgical History:  Procedure Laterality Date  . COLONOSCOPY  2015   mild diverticulosis, repeat 2025; Dr. Leone Orr  . INGUINAL HERNIA REPAIR Right 1995  . PROSTATE BIOPSY  2014   x 2; Dr. Annabell Orr    The following portions of the patient's history were reviewed and updated as appropriate: allergies, current medications, past family history, past medical history, past social history, past surgical history and problem list.  ROS Otherwise as in subjective above  Objective: BP (!) 150/88   Pulse 70   Ht 5\' 7"  (1.702 m)   Wt 228 lb (103.4 kg)   SpO2 96%   BMI 35.71 kg/m   General appearance: alert, no distress, well developed, well nourished Neck: supple, no lymphadenopathy, no thyromegaly, no masses Heart: RRR, normal S1, S2, no murmurs  Lungs: CTA bilaterally, no wheezes, rhonchi, or rales Right fourth finger nail with scarring deformity of the nail likely from prior trauma, slight pinkish coloration around the nailbed suggestive of a recent paronychia Right lower leg with some localized edema from midshaft to the foot with slight asymmetry compared to the left lower leg, there is some erythema on the anterior midshaft along with scattered crusting from recent abrasion, around the same area is a slight puffy area not quite fluctuant but seems to be warm and slightly pink-reddish coloration.  No calf pain or swelling Pulses: 2+ radial pulses, 2+ pedal pulses, normal cap refill Ext: no edema   Assessment: Encounter Diagnoses  Name Primary?  . Right  leg injury, initial encounter Yes  . Right leg pain   . Cellulitis of right lower extremity   . Paronychia of finger of right hand   . Essential hypertension   . Atherosclerosis of aorta (HCC)   . Hyperlipidemia, unspecified hyperlipidemia type   . Nail deformity      Plan: Right leg injury, right leg pain, leg cellulitis We discussed the following: Your exam suggests possible early cellulitis or infection of the leg, given the swelling, redness, and slight warmth.    You did have trauma to the leg with break in the skin.  I don't suspect blood clot today, but if any changes such as worse calve pain, purplish coloration, more pain and swelling in general, then recheck right away  Otherwise:  Begin antibiotic keflex 1 tablet three times daily  Elevated the leg when possible  Alternate cool and warm therapy, such as warm towel 20 minutes, then a little later do cold towel x 20 minutes  Keep the leg clean with soap and water  Begin Ibuprofen 600 mg every 8 hours (sent to pharmacy). This is for pain and inflammation  This should gradually improve over the next week.  However, If worse pain, swelling, redness, warmth, fever, pus drainage, or localized worse redness and puffiness, then recheck right away   Paronychia finger recurrent, nail deformity-begin Keflex, go for x-ray of finger to rule out foreign body particular metallic foreign body.  May ultimately need to see hand surgeon due to recurrent infection and fingernail deformity   Hypertension-not at goal.  I reviewed his cardiology notes from [facility].  At his last visit he was unwilling to modify his medication regimen although he was not at goal.  He continues to have elevated pressure readings.  He did have an echocardiogram January 2020 showing moderate LVH, ejection fraction 58%.  Continue amlodipine 10 mg daily, continue lisinopril HCT 20/12.5 mg daily, add Toprol-XL 25 mg daily.   Atherosclerosis,  hyperlipidemia-continue statin, aspirin  [name] was seen today for leg swelling.  Diagnoses and all orders for this visit:  Right leg injury, initial encounter -     DG Tibia/Fibula Right; Future  Right leg pain -     DG Tibia/Fibula Right; Future  Cellulitis of right lower extremity  Paronychia of finger of right hand -     DG Finger Ring Right; Future  Essential hypertension  Atherosclerosis of aorta (HCC)  Hyperlipidemia, unspecified hyperlipidemia type  Nail deformity  Other orders -     ibuprofen (ADVIL) 600 MG tablet; Take 1 tablet (600 mg total) by mouth every 8 (eight) hours as needed. -     cephALEXin (KEFLEX) 500 MG capsule; Take 1 capsule (500 mg total) by mouth 3 (three) times daily. -     metoprolol  succinate (TOPROL-XL) 25 MG 24 hr tablet; Take 1 tablet (25 mg total) by mouth daily.    Follow up: pending xrays

## 2019-11-03 NOTE — Telephone Encounter (Signed)
Still pending x-rays from today.  After reviewing his chart I want to continue his amlodipine and lisinopril HCT but lets added Toprol-XL 25 mg once daily to get his blood pressure down to goal.  Continue rest of medicines as usual

## 2019-11-03 NOTE — Patient Instructions (Addendum)
Your exam suggests possible early cellulitis or infection of the leg, given the swelling, redness, and slight warmth.    You did have trauma to the leg with break in the skin.  I don't suspect blood clot today, but if any changes such as worse calve pain, purplish coloration, more pain and swelling in general, then recheck right away  Otherwise:  Begin antibiotic keflex 1 tablet three times daily  Elevated the leg when possible  Alternate cool and warm therapy, such as warm towel 20 minutes, then a little later do cold towel x 20 minutes  Keep the leg clean with soap and water  Begin Ibuprofen 600 mg every 8 hours (sent to pharmacy). This is for pain and inflammation  This should gradually improve over the next week   However, If worse pain, swelling, redness, warmth, fever, pus drainage, or localized worse redness and puffiness, then recheck right away     Please go to [facility] for your right leg, right ring finger xray.   Their hours are 8am - 4:30 pm Monday - Friday.  Take your insurance card with you.  [facility] [phone]  [address], [address] [place], [place] [address]  [address] [place], [place] [address]

## 2019-11-04 ENCOUNTER — Ambulatory Visit
Admission: RE | Admit: 2019-11-04 | Discharge: 2019-11-04 | Disposition: A | Discharge status: Home / Self Care | Payer: Medicare Other | Source: Ambulatory Visit | Attending: Medical | Admitting: Medical

## 2019-11-04 DIAGNOSIS — L03011 Cellulitis of right finger: Secondary | ICD-10-CM

## 2019-11-04 DIAGNOSIS — S8991XA Unspecified injury of right lower leg, initial encounter: Secondary | ICD-10-CM

## 2019-11-04 DIAGNOSIS — M79604 Pain in right leg: Secondary | ICD-10-CM

## 2019-11-04 NOTE — Telephone Encounter (Signed)
Left detail message on machine for patient. 

## 2019-11-18 ENCOUNTER — Ambulatory Visit (INDEPENDENT_AMBULATORY_CARE_PROVIDER_SITE_OTHER): Payer: Medicare Other | Admitting: Medical

## 2019-11-18 ENCOUNTER — Encounter: Payer: Self-pay | Admitting: Medical

## 2019-11-18 ENCOUNTER — Other Ambulatory Visit: Payer: Self-pay

## 2019-11-18 VITALS — BP 166/84 | HR 74 | Ht 67.0 in | Wt 227.8 lb

## 2019-11-18 DIAGNOSIS — L03115 Cellulitis of right lower limb: Secondary | ICD-10-CM

## 2019-11-18 DIAGNOSIS — Z23 Encounter for immunization: Secondary | ICD-10-CM | POA: Diagnosis not present

## 2019-11-18 DIAGNOSIS — S8011XD Contusion of right lower leg, subsequent encounter: Secondary | ICD-10-CM

## 2019-11-18 DIAGNOSIS — I10 Essential (primary) hypertension: Secondary | ICD-10-CM

## 2019-11-18 DIAGNOSIS — L03011 Cellulitis of right finger: Secondary | ICD-10-CM | POA: Diagnosis not present

## 2019-11-18 DIAGNOSIS — M79604 Pain in right leg: Secondary | ICD-10-CM

## 2019-11-18 DIAGNOSIS — I700 Atherosclerosis of aorta: Secondary | ICD-10-CM

## 2019-11-18 DIAGNOSIS — M20001 Unspecified deformity of right finger(s): Secondary | ICD-10-CM

## 2019-11-18 MED ORDER — CEPHALEXIN 500 MG PO CAPS: 500.0000 mg | ORAL_CAPSULE | Freq: Three times a day (TID) | ORAL | 0 refills | Status: DC

## 2019-11-18 NOTE — Patient Instructions (Addendum)
Leg hematoma  For the next 3 weeks use warm compresses with leg elevation daily in the evening  Use Aspirin 81mg , 3-4 tablets daily for about 2 weeks, then go back to the plain 81mg  daily  Consider ACE wrap or compression sleeve for the hematoma area  Just know that it may take weeks or months for the swelling area to resolve   Finger infection  Keep the right ring finger clean with soap and water  If you get a repeat episode of redness, pain, swelling, pus, then begin Keflex antibiotic.   I sent this as a refill today   High blood pressure Continue the Lisinopril HCT and Amlodipine but add Toprol xl once daily to get blood pressure down.  Goal is at least 130/80 or less

## 2019-11-18 NOTE — Progress Notes (Signed)
Subjective:  Luke Orr is a 69 y.o. male who presents for Chief Complaint  Patient presents with  . Follow-up     Here for multiple concerns  He has a history of hypertension, hyperlipidemia, BPH, atherosclerosis, obesity.  He notes last week on August 25 he was in his garage and he was turning moving around and accidentally hit his right lower leg on an aluminum table.   He did have a break in the skin with a wound.  Over the course of the last few days the he notes some mild redness, swelling, worse pain.  No fever no body aches or chills, no nausea or vomiting.  The majority of the lower leg is swollen at this point.  No history of DVT.  He notes that his right ring finger has been getting nailbed infections for the last few months.  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  He wonders if he has something about of the skin.  He did damage the fingernail in the past almost ripping it off.  From last visit he has not started Toprol.   He just lost another sister recently. She died of covid.  Has 3 sisters and 2 brothers left.   No other aggravating or relieving factors.    No other c/o.  Past Medical History:  Diagnosis Date  . Abnormal abdominal ultrasound 03/2018   no AAA, atherosclerosis present  . Arthritis    right knee  . BPH (benign prostatic hypertrophy)    with urinary urgency and weak stream, followed by Dr. Bjorn Pippin, [facility]  . Bulging lumbar disc    self reported  . DDD (degenerative disc disease), lumbar   . Elevated PSA    Dr. Annabell Howells, [facility]  . Full dentures   . H/O echocardiogram 03/2018   [facility], mod concetric LVH, EF 58%. mild left atrial enlargement  . Hyperlipidemia   . Hypertension   . Impaired fasting blood sugar   . Obesity   . Obesity     Current Outpatient Medications on File Prior to Visit  Medication Sig Dispense Refill  . amLODipine (NORVASC) 10 MG tablet Take 1 tablet (10 mg  total) by mouth daily. 90 tablet 3  . aspirin EC 81 MG tablet Take 1 tablet (81 mg total) by mouth daily. 90 tablet 3  . lisinopril-hydrochlorothiazide (ZESTORETIC) 20-12.5 MG tablet Take 1 tablet by mouth daily. 90 tablet 3  . rosuvastatin (CRESTOR) 20 MG tablet Take 1 tablet (20 mg total) by mouth daily. 90 tablet 3  . ibuprofen (ADVIL) 600 MG tablet Take 1 tablet (600 mg total) by mouth every 8 (eight) hours as needed. (Patient not taking: Reported on 11/18/2019) 15 tablet 0  . metoprolol succinate (TOPROL-XL) 25 MG 24 hr tablet Take 1 tablet (25 mg total) by mouth daily. (Patient not taking: Reported on 11/18/2019) 30 tablet 1   No current facility-administered medications on file prior to visit.   Past Surgical History:  Procedure Laterality Date  . COLONOSCOPY  2015   mild diverticulosis, repeat 2025; Dr. Leone Payor  . INGUINAL HERNIA REPAIR Right 1995  . PROSTATE BIOPSY  2014   x 2; Dr. Annabell Howells    The following portions of the patient's history were reviewed and updated as appropriate: allergies, current medications, past family history, past medical history, past social history, past surgical history and problem list.  ROS Otherwise as in subjective above    Objective: BP Marland Kitchen)  166/84   Pulse 74   Ht 5\' 7"  (1.702 m)   Wt 227 lb 12.8 oz (103.3 kg)   SpO2 97%   BMI 35.68 kg/m    Wt Readings from Last 3 Encounters:  11/18/19 227 lb 12.8 oz (103.3 kg)  11/03/19 228 lb (103.4 kg)  09/24/19 (!) 221 lb 6.4 oz (100.4 kg)   BP Readings from Last 3 Encounters:  11/18/19 (!) 166/84  11/03/19 (!) 150/88  09/24/19 (!) 132/80    General appearance: alert, no distress, well developed, well nourished Neck: supple, no lymphadenopathy, no thyromegaly, no masses Heart: RRR, normal S1, S2, no murmurs Lungs: CTA bilaterally, no wheezes, rhonchi, or rales Right fourth finger nail with scarring deformity of the nail likely from prior trauma, resolved pinkish coloration around the nailbed  suggestive of a recent paronychia Right lower leg with some localized edema midshaft , a slight indurated 4 cm diameter area of mid shaft anterior and anterolateral, but no erythema or warmth, improved from last visit.   No general swelling of leg now.  No calf pain or swelling Pulses: 2+ radial pulses, 2+ pedal pulses, normal cap refill Ext: no edema    Assessment: Encounter Diagnoses  Name Primary?  . Need for influenza vaccination Yes  . Essential hypertension   . Paronychia of finger of right hand   . Atherosclerosis of aorta (HCC)   . Acquired deformity of finger of right hand   . Right leg pain   . Cellulitis of right lower extremity      Plan: Leg hematoma, leg injury-much improved from last visit from elevation of leg, antibiotic, ibuprofen.  We discussed continuing leg elevation and warm compresses for the next few weeks, and up to 325 mg of aspirin for the next 2 weeks The hematoma will gradually resolve.  Reassured  Paronychia resolved-we will have Keflex on hand for future infection.  We discussed good hygiene.  If he continues to have problems with his finger we may need to have him see specialist  I expressed my sympathy for his recent death of his sister  Hypertension -  reiterated the need to start the Toprol from last visit and continue his other medications for blood pressure as he is not at goal  Atherosclerosis, hyperlipidemia-continue statin, aspirin  Ekin was seen today for follow-up.  Diagnoses and all orders for this visit:  Need for influenza vaccination  Essential hypertension  Paronychia of finger of right hand  Atherosclerosis of aorta (HCC)  Acquired deformity of finger of right hand  Right leg pain  Cellulitis of right lower extremity  Other orders -     cephALEXin (KEFLEX) 500 MG capsule; Take 1 capsule (500 mg total) by mouth 3 (three) times daily.   Follow up: 62mo on BP

## 2019-11-24 ENCOUNTER — Ambulatory Visit
Admission: RE | Admit: 2019-11-24 | Discharge: 2019-11-24 | Disposition: A | Discharge status: Home / Self Care | Payer: Medicare Other | Source: Ambulatory Visit | Attending: Urology | Admitting: Urology

## 2019-11-24 DIAGNOSIS — R972 Elevated prostate specific antigen [PSA]: Secondary | ICD-10-CM

## 2019-11-24 MED ORDER — GADOBENATE DIMEGLUMINE 529 MG/ML IV SOLN
20.0000 mL | Freq: Once | INTRAVENOUS | Status: AC | PRN
  Administered 2019-11-24: 20 mL via INTRAVENOUS

## 2020-02-24 ENCOUNTER — Encounter: Payer: Self-pay | Admitting: Medical

## 2020-02-24 ENCOUNTER — Other Ambulatory Visit: Payer: Self-pay

## 2020-02-24 ENCOUNTER — Ambulatory Visit (INDEPENDENT_AMBULATORY_CARE_PROVIDER_SITE_OTHER): Payer: Medicare Other | Admitting: Medical

## 2020-02-24 VITALS — BP 128/78 | HR 78 | Ht 67.0 in | Wt 229.4 lb

## 2020-02-24 DIAGNOSIS — I517 Cardiomegaly: Secondary | ICD-10-CM | POA: Diagnosis not present

## 2020-02-24 DIAGNOSIS — I10 Essential (primary) hypertension: Secondary | ICD-10-CM | POA: Diagnosis not present

## 2020-02-24 DIAGNOSIS — Z7185 Encounter for immunization safety counseling: Secondary | ICD-10-CM

## 2020-02-24 NOTE — Patient Instructions (Signed)
Hypertension (high blood pressure):  Specific recommendations:  Exercise regularly, preferably every day with aerobic exercise for 30 minutes or more  Avoid added salt in the diet.   Follow a DASH eating plan or Mediterranean eating plan  Check blood pressures 2-3 times weekly and record.   Hypertension, commonly called high blood pressure, is when the force of blood pumping through your arteries is too strong. Your arteries are the blood vessels that carry blood from your heart throughout your body. A blood pressure reading consists of a higher number over a lower number, such as 110/72. The higher number (systolic) is the pressure inside your arteries when your heart pumps. The lower number (diastolic) is the pressure inside your arteries when your heart relaxes. Ideally you want your blood pressure below 120/80. Hypertension forces your heart to work harder to pump blood. Your arteries may become narrow or stiff. Having hypertension puts you at risk for heart disease, stroke, and other problems.  RISK FACTORS Some risk factors for high blood pressure are controllable. Others are not.  Risk factors you cannot control include:   Race. You may be at higher risk if you are African American.  Age. Risk increases with age.  Gender. Men are at higher risk than women before age 72 years. After age 70, women are at higher risk than men. Risk factors you can control include:  Not getting enough exercise or physical activity.  Being overweight.  Getting too much fat, sugar, calories, or salt in your diet.  Drinking too much alcohol. SIGNS AND SYMPTOMS Hypertension does not usually cause signs or symptoms. Extremely high blood pressure (hypertensive crisis) may cause headache, anxiety, shortness of breath, and nosebleed. DIAGNOSIS  To check if you have hypertension, your health care provider will measure your blood pressure while you are seated, with your arm held at the level of your  heart. It should be measured at least twice using the same arm. Certain conditions can cause a difference in blood pressure between your right and left arms. A blood pressure reading that is higher than normal on one occasion does not mean that you need treatment. If one blood pressure reading is high, ask your health care provider about having it checked again. BLOOD PRESSURE STAGES Blood pressure is classified into four stages: normal, prehypertension, stage 1, and stage 2. Your blood pressure reading will be used to determine what type of treatment, if any, is necessary. Appropriate treatment options are tied to these four stages:  Normal  Systolic pressure (mm Hg): below 120.  Diastolic pressure (mm Hg): below 80. Prehypertension  Systolic pressure (mm Hg): 120 to 139.  Diastolic pressure (mm Hg): 80 to 89. Stage1  Systolic pressure (mm Hg): 140 to 159.  Diastolic pressure (mm Hg): 90 to 99. Stage2  Systolic pressure (mm Hg): 160 or above.  Diastolic pressure (mm Hg): 100 or above. RISKS RELATED TO HIGH BLOOD PRESSURE Managing your blood pressure is an important responsibility. Uncontrolled high blood pressure can lead to:  A heart attack.  A stroke.  A weakened blood vessel (aneurysm).  Heart failure.  Kidney damage.  Eye damage.  Metabolic syndrome.  Memory and concentration problems. TREATMENT  Treating high blood pressure includes making lifestyle changes and possibly taking medicine. Living a healthy lifestyle can help lower high blood pressure. You may need to change some of your habits. Lifestyle changes may include:  Following the DASH diet. This diet is high in fruits, vegetables, and whole grains. It is  low in salt, red meat, and added sugars.  Getting at least 2 hours of brisk physical activity every week.  Losing weight if necessary.  Not smoking.  Limiting alcoholic beverages.  Learning ways to reduce stress. If lifestyle changes are not  enough to get your blood pressure under control, your health care provider may prescribe medicine. You may need to take more than one. Work closely with your health care provider to understand the risks and benefits. HOME CARE INSTRUCTIONS  Have your blood pressure rechecked as directed by your health care provider.   Take medicines only as directed by your health care provider. Follow the directions carefully. Blood pressure medicines must be taken as prescribed. The medicine does not work as well when you skip doses. Skipping doses also puts you at risk for problems.   Do not smoke.   Monitor your blood pressure at home as directed by your health care provider. SEEK MEDICAL CARE IF:   You think you are having a reaction to medicines taken.  You have recurrent headaches or feel dizzy.  You have swelling in your ankles.  You have trouble with your vision. SEEK IMMEDIATE MEDICAL CARE IF:  You develop a severe headache or confusion.  You have unusual weakness, numbness, or feel faint.  You have severe chest or abdominal pain.  You vomit repeatedly.  You have trouble breathing. MAKE SURE YOU:   Understand these instructions.  Will watch your condition.  Will get help right away if you are not doing well or get worse. Document Released: 02/19/2005 Document Revised: 07/06/2013 Document Reviewed: 12/12/2012 [facility] Patient Information 2015 [place], [place]. This information is not intended to replace advice given to you by your health care provider. Make sure you discuss any questions you have with your health care provider.    DASH Eating Plan DASH stands for "Dietary Approaches to Stop Hypertension." The DASH eating plan is a healthy eating plan that has been shown to reduce high blood pressure (hypertension). Additional health benefits may include reducing the risk of type 2 diabetes mellitus, heart disease, and stroke. The DASH eating plan may also help with weight  loss. WHAT DO I NEED TO KNOW ABOUT THE DASH EATING PLAN? For the DASH eating plan, you will follow these general guidelines:  Choose foods with a percent daily value for sodium of less than 5% (as listed on the food label).  Use salt-free seasonings or herbs instead of table salt or sea salt.  Check with your health care provider or pharmacist before using salt substitutes.  Eat lower-sodium products, often labeled as "lower sodium" or "no salt added."  Eat fresh foods.  Eat more vegetables, fruits, and low-fat dairy products.  Choose whole grains. Look for the word "whole" as the first word in the ingredient list.  Choose fish and skinless chicken or [place] more often than red meat. Limit fish, poultry, and meat to 6 oz (170 g) each day.  Limit sweets, desserts, sugars, and sugary drinks.  Choose heart-healthy fats.  Limit cheese to 1 oz (28 g) per day.  Eat more home-cooked food and less restaurant, buffet, and fast food.  Limit fried foods.  Cook foods using methods other than frying.  Limit canned vegetables. If you do use them, rinse them well to decrease the sodium.  When eating at a restaurant, ask that your food be prepared with less salt, or no salt if possible. WHAT FOODS CAN I EAT? Seek help from a dietitian for individual  calorie needs. Grains Whole grain or whole wheat bread. Brown rice. Whole grain or whole wheat pasta. Quinoa, bulgur, and whole grain cereals. Low-sodium cereals. Corn or whole wheat flour tortillas. Whole grain cornbread. Whole grain crackers. Low-sodium crackers. Vegetables Fresh or frozen vegetables (raw, steamed, roasted, or grilled). Low-sodium or reduced-sodium tomato and vegetable juices. Low-sodium or reduced-sodium tomato sauce and paste. Low-sodium or reduced-sodium canned vegetables.  Fruits All fresh, canned (in natural juice), or frozen fruits. Meat and Other Protein Products Ground beef (85% or leaner), grass-fed beef, or beef  trimmed of fat. Skinless chicken or [place]. Ground chicken or [place]. Pork trimmed of fat. All fish and seafood. Eggs. Dried beans, peas, or lentils. Unsalted nuts and seeds. Unsalted canned beans. Dairy Low-fat dairy products, such as skim or 1% milk, 2% or reduced-fat cheeses, low-fat ricotta or cottage cheese, or plain low-fat yogurt. Low-sodium or reduced-sodium cheeses. Fats and Oils Tub margarines without trans fats. Light or reduced-fat mayonnaise and salad dressings (reduced sodium). Avocado. Safflower, olive, or canola oils. Natural peanut or almond butter. Other Unsalted popcorn and pretzels. The items listed above may not be a complete list of recommended foods or beverages. Contact your dietitian for more options. WHAT FOODS ARE NOT RECOMMENDED? Grains White bread. White pasta. White rice. Refined cornbread. Bagels and croissants. Crackers that contain trans fat. Vegetables Creamed or fried vegetables. Vegetables in a cheese sauce. Regular canned vegetables. Regular canned tomato sauce and paste. Regular tomato and vegetable juices. Fruits Dried fruits. Canned fruit in light or heavy syrup. Fruit juice. Meat and Other Protein Products Fatty cuts of meat. Ribs, chicken wings, bacon, sausage, bologna, salami, chitterlings, fatback, hot dogs, bratwurst, and packaged luncheon meats. Salted nuts and seeds. Canned beans with salt. Dairy Whole or 2% milk, cream, half-and-half, and cream cheese. Whole-fat or sweetened yogurt. Full-fat cheeses or blue cheese. Nondairy creamers and whipped toppings. Processed cheese, cheese spreads, or cheese curds. Condiments Onion and garlic salt, seasoned salt, table salt, and sea salt. Canned and packaged gravies. Worcestershire sauce. Tartar sauce. Barbecue sauce. Teriyaki sauce. Soy sauce, including reduced sodium. Steak sauce. Fish sauce. Oyster sauce. Cocktail sauce. Horseradish. Ketchup and mustard. Meat flavorings and tenderizers. Bouillon cubes. Hot  sauce. Tabasco sauce. Marinades. Taco seasonings. Relishes. Fats and Oils Butter, stick margarine, lard, shortening, ghee, and bacon fat. Coconut, palm kernel, or palm oils. Regular salad dressings. Other Pickles and olives. Salted popcorn and pretzels. The items listed above may not be a complete list of foods and beverages to avoid. Contact your dietitian for more information. WHERE CAN I FIND MORE INFORMATION? National Heart, Lung, and Blood Institute: [employer].itwww.nhlbi.nih.gov/health/health-topics/topics/dash/ Document Released: 02/08/2011 Document Revised: 07/06/2013 Document Reviewed: 12/24/2012 [facility] Patient Information 2015 [place]. This information is not intended to replace advice given to you by your health care provider. Make sure you discuss any questions you have with your health care provider.        Why follow it? Research shows. . Those who follow the Mediterranean diet have a reduced risk of heart disease  . The diet is associated with a reduced incidence of Parkinson's and Alzheimer's diseases . People following the diet may have longer life expectancies and lower rates of chronic diseases  . The Dietary Guidelines for Americans recommends the Mediterranean diet as an eating plan to promote health and prevent disease  What Is the Mediterranean Diet?  . Healthy eating plan based on typical foods and recipes of Mediterranean-style cooking . The diet is primarily a plant based diet;  these foods should make up a majority of meals   Starches - Plant based foods should make up a majority of meals - They are an important sources of vitamins, minerals, energy, antioxidants, and fiber - Choose whole grains, foods high in fiber and minimally processed items  - Typical grain sources include wheat, oats, barley, corn, brown rice, bulgar, farro, millet, polenta, couscous  - Various types of beans include chickpeas, lentils, fava beans, black beans, white beans   Fruits  Veggies  - Large quantities of antioxidant rich fruits & veggies; 6 or more servings  - Vegetables can be eaten raw or lightly drizzled with oil and cooked  - Vegetables common to the traditional Mediterranean Diet include: artichokes, arugula, beets, broccoli, brussel sprouts, cabbage, carrots, celery, collard greens, cucumbers, eggplant, kale, leeks, lemons, lettuce, mushrooms, okra, onions, peas, peppers, potatoes, pumpkin, radishes, rutabaga, shallots, spinach, sweet potatoes, turnips, zucchini - Fruits common to the Mediterranean Diet include: apples, apricots, avocados, cherries, clementines, dates, figs, grapefruits, grapes, melons, nectarines, oranges, peaches, pears, pomegranates, strawberries, tangerines  Fats - Replace butter and margarine with healthy oils, such as olive oil, canola oil, and tahini  - Limit nuts to no more than a handful a day  - Nuts include walnuts, almonds, pecans, pistachios, pine nuts  - Limit or avoid candied, honey roasted or heavily salted nuts - Olives are central to the [employer] - can be eaten whole or used in a variety of dishes   Meats Protein - Limiting red meat: no more than a few times a month - When eating red meat: choose lean cuts and keep the portion to the size of deck of cards - Eggs: approx. 0 to 4 times a week  - Fish and lean poultry: at least 2 a week  - Healthy protein sources include, chicken, [place], lean beef, lamb - Increase intake of seafood such as tuna, salmon, trout, mackerel, shrimp, scallops - Avoid or limit high fat processed meats such as sausage and bacon  Dairy - Include moderate amounts of low fat dairy products  - Focus on healthy dairy such as fat free yogurt, skim milk, low or reduced fat cheese - Limit dairy products higher in fat such as whole or 2% milk, cheese, ice cream  Alcohol - Moderate amounts of red wine is ok  - No more than 5 oz daily for women (all ages) and men older than age 66  - No more than 10 oz of wine  daily for men younger than 22  Other - Limit sweets and other desserts  - Use herbs and spices instead of salt to flavor foods  - Herbs and spices common to the traditional Mediterranean Diet include: basil, bay leaves, chives, cloves, cumin, fennel, garlic, lavender, marjoram, mint, oregano, parsley, pepper, rosemary, sage, savory, sumac, tarragon, thyme   It's not just a diet, it's a lifestyle:  . The Mediterranean diet includes lifestyle factors typical of those in the region  . Foods, drinks and meals are best eaten with others and savored . Daily physical activity is important for overall good health . This could be strenuous exercise like running and aerobics . This could also be more leisurely activities such as walking, housework, yard-work, or taking the stairs . Moderation is the key; a balanced and healthy diet accommodates most foods and drinks . Consider portion sizes and frequency of consumption of certain foods   Meal Ideas & Options:  . Breakfast:  o Whole wheat toast or  whole wheat English muffins with peanut butter & hard boiled egg o Steel cut oats topped with apples & cinnamon and skim milk  o Fresh fruit: banana, strawberries, melon, berries, peaches  o Smoothies: strawberries, bananas, greek yogurt, peanut butter o Low fat greek yogurt with blueberries and granola  o Egg white omelet with spinach and mushrooms o Breakfast couscous: whole wheat couscous, apricots, skim milk, cranberries  . Sandwiches:  o Hummus and grilled vegetables (peppers, zucchini, squash) on whole wheat bread   o Grilled chicken on whole wheat pita with lettuce, tomatoes, cucumbers or tzatziki  o Tuna salad on whole wheat bread: tuna salad made with greek yogurt, olives, red peppers, capers, green onions o Garlic rosemary lamb pita: lamb sauted with garlic, rosemary, salt & pepper; add lettuce, cucumber, greek yogurt to pita - flavor with lemon juice and black pepper  . Seafood:   o Mediterranean grilled salmon, seasoned with garlic, basil, parsley, lemon juice and black pepper o Shrimp, lemon, and spinach whole-grain pasta salad made with low fat greek yogurt  o Seared scallops with lemon orzo  o Seared tuna steaks seasoned salt, pepper, coriander topped with tomato mixture of olives, tomatoes, olive oil, minced garlic, parsley, green onions and cappers  . Meats:  o Herbed greek chicken salad with kalamata olives, cucumber, feta  o Red bell peppers stuffed with spinach, bulgur, lean ground beef (or lentils) & topped with feta   o Kebabs: skewers of chicken, tomatoes, onions, zucchini, squash  o [place] burgers: made with red onions, mint, dill, lemon juice, feta cheese topped with roasted red peppers . Vegetarian o Cucumber salad: cucumbers, artichoke hearts, celery, red onion, feta cheese, tossed in olive oil & lemon juice  o Hummus and whole grain pita points with a greek salad (lettuce, tomato, feta, olives, cucumbers, red onion) o Lentil soup with celery, carrots made with vegetable broth, garlic, salt and pepper  o Tabouli salad: parsley, bulgur, mint, scallions, cucumbers, tomato, radishes, lemon juice, olive oil, salt and pepper.

## 2020-02-24 NOTE — Progress Notes (Signed)
Subjective:  Luke Orr is a 69 y.o. male who presents for Chief Complaint  Patient presents with  . Hypertension    Here for concern for elevated blood pressure.  He reports normal home readings . Routine in the 120/70s at home.   Recently home health nurse came out to do house calls.  She checked his BP and it was 170/80.  He gets nervous with nurse or doctor's offices checking his BPs.  He uses his cuff regularly and gets good numbers all the time, 120/70s.    He limits salt, exercises, and is trying to stay in good health.  No chest pain, no dyspnea, no edema, no palpiations.   No other aggravating or relieving factors. No other complaint.  Past Medical History:  Diagnosis Date  . Abnormal abdominal ultrasound 03/2018   no AAA, atherosclerosis present  . Arthritis    right knee  . BPH (benign prostatic hypertrophy)    with urinary urgency and weak stream, followed by Luke Orr, [facility]  . Bulging lumbar disc    self reported  . DDD (degenerative disc disease), lumbar   . Elevated PSA    Luke Orr, [facility]  . Full dentures   . H/O echocardiogram 03/2018   [facility], mod concetric LVH, EF 58%. mild left atrial enlargement  . Hyperlipidemia   . Hypertension   . Impaired fasting blood sugar   . Obesity   . Obesity    Current Outpatient Medications on File Prior to Visit  Medication Sig Dispense Refill  . amLODipine (NORVASC) 10 MG tablet Take 1 tablet (10 mg total) by mouth daily. 90 tablet 3  . aspirin EC 81 MG tablet Take 1 tablet (81 mg total) by mouth daily. 90 tablet 3  . lisinopril-hydrochlorothiazide (ZESTORETIC) 20-12.5 MG tablet Take 1 tablet by mouth daily. 90 tablet 3  . rosuvastatin (CRESTOR) 20 MG tablet Take 1 tablet (20 mg total) by mouth daily. 90 tablet 3  . cephALEXin (KEFLEX) 500 MG capsule Take 1 capsule (500 mg total) by mouth 3 (three) times daily. (Patient not taking: Reported on 02/24/2020) 30 capsule 0  .  ibuprofen (ADVIL) 600 MG tablet Take 1 tablet (600 mg total) by mouth every 8 (eight) hours as needed. (Patient not taking: No sig reported) 15 tablet 0  . metoprolol succinate (TOPROL-XL) 25 MG 24 hr tablet Take 1 tablet (25 mg total) by mouth daily. (Patient not taking: No sig reported) 30 tablet 1   No current facility-administered medications on file prior to visit.   The following portions of the patient's history were reviewed and updated as appropriate: allergies, current medications, past family history, past medical history, past social history, past surgical history and problem list.  ROS Otherwise as in subjective above     Objective: BP 128/78   Pulse 78   Ht 5\' 7"  (1.702 m)   Wt 229 lb 6.4 oz (104.1 kg)   SpO2 97%   BMI 35.93 kg/m   Patient tested his right arm with his home cuff 182/112  Nurse tested left arm, 180/90   General appearance: alert, no distress, well developed, well nourished Neck: supple, no lymphadenopathy, no thyromegaly, no masses, no bruits Heart: RRR, normal S1, S2, no murmurs Lungs: CTA bilaterally, no wheezes, rhonchi, or rales Abdomen: +bs, soft, non tender, non distended, no masses, no hepatomegaly, no splenomegaly Pulses: 2+ radial pulses, 2+ pedal pulses, normal cap refill Ext: no edema    Assessment: Encounter Diagnoses  Name Primary?  . Essential hypertension Yes  . White coat syndrome with hypertension   . LVH (left ventricular hypertrophy)   . Mild atrial enlargement, left   . Vaccine counseling      Plan: HTN - he definitely has a component of white coat hypertension/anxiety with doctors offices.   His BP cuff compared to ours today is in line with each other.  Thus, I trust his home readings.   He will monitor and get me updated readings in 3-4 weeks.  Counseled on low salt and healthy diet, exercise, limiting animal products/meat, DASH diet, and working on losing weight.  I reviewed the house calls document from home  health.  She had incorrectly noted that he needed flu and pneumococcal vaccines . He is up to date on Pneumococcal, flu and covid and tetanus vaccines  Shingles vaccine:  I recommend you have a shingles vaccine to help prevent shingles or herpes zoster outbreak.   Please call your insurer to inquire about coverage for the Shingrix vaccine given in 2 doses.   Some insurers cover this vaccine after age 30, some cover this after age 11.  If your insurer covers this, then call to schedule appointment to have this vaccine here.   Luke Orr was seen today for hypertension.  Diagnoses and all orders for this visit:  Essential hypertension  White coat syndrome with hypertension  LVH (left ventricular hypertrophy)  Mild atrial enlargement, left  Vaccine counseling    Follow up: home BP readings in a month

## 2020-10-21 ENCOUNTER — Other Ambulatory Visit: Payer: Self-pay | Admitting: Medical

## 2020-10-21 DIAGNOSIS — I10 Essential (primary) hypertension: Secondary | ICD-10-CM

## 2020-11-03 ENCOUNTER — Other Ambulatory Visit: Payer: Self-pay

## 2020-11-03 ENCOUNTER — Ambulatory Visit (INDEPENDENT_AMBULATORY_CARE_PROVIDER_SITE_OTHER): Payer: Medicare Other | Admitting: Medical

## 2020-11-03 VITALS — BP 150/84 | HR 57 | Temp 97.6°F | Wt 233.6 lb

## 2020-11-03 DIAGNOSIS — R140 Abdominal distension (gaseous): Secondary | ICD-10-CM | POA: Diagnosis not present

## 2020-11-03 DIAGNOSIS — Z23 Encounter for immunization: Secondary | ICD-10-CM

## 2020-11-03 DIAGNOSIS — R143 Flatulence: Secondary | ICD-10-CM

## 2020-11-03 NOTE — Progress Notes (Signed)
Subjective:  Luke Orr is a 70 y.o. male who presents for Chief Complaint  Patient presents with   gas pain    Gas pain for a couple months. Xgas, rolaids, peptol and no relief     Having gas pains and bloating for about 2 months.  Tried gas x, rolaids, peptol and nothing seems to help.  Having 1 or more BM daily.  No constipation,no diarrhea, no blood in stool.  No night sweats.  No diet changes.    Eats 2 boiled eggs and coffee for breakfast.  Typical lunch is [place] sandwich, cucumber. Eats different things at supper  Otherwise normal state of health  No other aggravating or relieving factors.    No other c/o.  Past Medical History:  Diagnosis Date   Abnormal abdominal ultrasound 03/2018   no AAA, atherosclerosis present   Arthritis    right knee   BPH (benign prostatic hypertrophy)    with urinary urgency and weak stream, followed by Dr. Bjorn Pippin, [facility]   Bulging lumbar disc    self reported   DDD (degenerative disc disease), lumbar    Elevated PSA    Dr. Annabell Howells, [facility]   Full dentures    H/O echocardiogram 03/2018   [facility], mod concetric LVH, EF 58%. mild left atrial enlargement   Hyperlipidemia    Hypertension    Impaired fasting blood sugar    Obesity    Obesity    Past Surgical History:  Procedure Laterality Date   COLONOSCOPY  2015   mild diverticulosis, repeat 2025; Dr. Deberah Castle HERNIA REPAIR Right 1995   PROSTATE BIOPSY  2014   x 2; Dr. Annabell Howells    The following portions of the patient's history were reviewed and updated as appropriate: allergies, current medications, past family history, past medical history, past social history, past surgical history and problem list.  ROS Otherwise as in subjective above  Objective: BP (!) 150/84   Pulse (!) 57   Temp 97.6 F (36.4 C)   Wt 233 lb 9.6 oz (106 kg)   BMI 36.59 kg/m   Wt Readings from Last 3 Encounters:  11/03/20 233 lb 9.6 oz (106 kg)   02/24/20 229 lb 6.4 oz (104.1 kg)  11/18/19 227 lb 12.8 oz (103.3 kg)   BP Readings from Last 3 Encounters:  11/03/20 (!) 150/84  02/24/20 128/78  11/18/19 (!) 166/84    General appearance: alert, no distress, well developed, well nourished Neck: supple, no lymphadenopathy, no thyromegaly, no masses Heart: RRR, normal S1, S2, no murmurs Lungs: CTA bilaterally, no wheezes, rhonchi, or rales Abdomen: +bs, soft, non tender, non distended, no masses, no hepatomegaly, no splenomegaly Pulses: 2+ radial pulses, 2+ pedal pulses, normal cap refill Ext: no edema   Assessment: Encounter Diagnoses  Name Primary?   Bloating Yes   Flatulence    Needs flu shot      Plan: Discussed symptoms and concerns.  Likely diet related.  We discussed other possible differential.  We discussed the recommendations below.  If not much improved within the next week then recheck  Recommendations: Hydrate well with water throughout the day such as 80 to 100 ounces of water daily Begin MiraLAX pack 1 capful in a beverage every hour until you have cleared your feces or cleansed out your poo Fast today with just clear fluids such as water, soup broth, ginger ale or other For the next week I would avoid big portions, avoid cheese,  pinto beans, onions, Brussels sprouts or other gas-forming foods Lets see if this does not calm things down If you are continue to have the same symptoms over the next 2 to 3 weeks we can consider x-ray of your abdomen or other potential evaluation and treatment   Counseled on the influenza virus vaccine.  Vaccine information sheet given.  Influenza vaccine given after consent obtained.    Luke Orr was seen today for gas pain.  Diagnoses and all orders for this visit:  Bloating  Flatulence  Needs flu shot -     Flu Vaccine QUAD High Dose(Fluad)   Follow up: prn

## 2020-11-03 NOTE — Patient Instructions (Signed)
Recommendations: Hydrate well with water throughout the day such as 80 to 100 ounces of water daily Begin MiraLAX pack 1 capful in a beverage every hour until you have cleared your feces or cleansed out your poo Fast today with just clear fluids such as water, soup broth, ginger ale or other4 For the next week I would avoid big portions, avoid cheese, pinto beans, onions, Brussels sprouts or other gas-forming foods Lets see if this does not calm things down If you are continue to have the same symptoms over the next 2 to 3 weeks we can consider x-ray of your abdomen or other potential evaluation and treatment  Abdominal Bloating When you have abdominal bloating, your abdomen may feel full, tight, or painful. It may also look bigger than normal or swollen (distended). Common causes of abdominal bloating include: Swallowing air. Constipation. Problems digesting food. Eating too much. Irritable bowel syndrome. This is a condition that affects the large intestine. Lactose intolerance. This is an inability to digest lactose, a natural sugar in dairy products. Celiac disease. This is a condition that affects the ability to digest gluten, a protein found in some grains. Gastroparesis. This is a condition that slows down the movement of food in the stomach and small intestine. It is more common in people with diabetes mellitus. Gastroesophageal reflux disease (GERD). This is a digestive condition that makes stomach acid flow back into the esophagus. Urinary retention. This means that the body is holding onto urine, and the bladder cannot be emptied all the way. Follow these instructions at home: Eating and drinking Avoid eating too much. Try not to swallow air while talking or eating. Avoid eating while lying down. Avoid these foods and drinks: Foods that cause gas, such as broccoli, cabbage, cauliflower, and baked beans. Carbonated drinks. Hard candy. Chewing gum. Medicines Take  over-the-counter and prescription medicines only as told by your health care provider. Take probiotic medicines. These medicines contain live bacteria or yeasts that can help digestion. Take coated peppermint oil capsules. Activity Try to exercise regularly. Exercise may help to relieve bloating that is caused by gas and relieve constipation. General instructions Keep all follow-up visits as told by your health care provider. This is important. Contact a health care provider if: You have nausea and vomiting. You have diarrhea. You have abdominal pain. You have unusual weight loss or weight gain. You have severe pain, and medicines do not help. Get help right away if: You have severe chest pain. You have trouble breathing. You have shortness of breath. You have trouble urinating. You have darker urine than normal. You have blood in your stools or have dark, tarry stools. Summary Abdominal bloating means that the abdomen is swollen. Common causes of abdominal bloating are swallowing air, constipation, and problems digesting food. Avoid eating too much and avoid swallowing air. Avoid foods that cause gas, carbonated drinks, hard candy, and chewing gum. This information is not intended to replace advice given to you by your health care provider. Make sure you discuss any questions you have with your health care provider. Document Revised: 06/09/2018 Document Reviewed: 03/23/2016 Elsevier Patient Education  2021 [employer].

## 2020-11-19 ENCOUNTER — Other Ambulatory Visit: Payer: Self-pay | Admitting: Medical

## 2020-11-19 DIAGNOSIS — I10 Essential (primary) hypertension: Secondary | ICD-10-CM

## 2020-12-07 LAB — HM HEPATITIS C SCREENING LAB: HM Hepatitis Screen: NEGATIVE

## 2020-12-13 ENCOUNTER — Encounter: Payer: Self-pay | Admitting: Medical

## 2020-12-13 ENCOUNTER — Ambulatory Visit (INDEPENDENT_AMBULATORY_CARE_PROVIDER_SITE_OTHER): Payer: Medicare Other | Admitting: Medical

## 2020-12-13 ENCOUNTER — Other Ambulatory Visit: Payer: Self-pay

## 2020-12-13 VITALS — BP 130/80 | HR 65 | Wt 231.8 lb

## 2020-12-13 DIAGNOSIS — I700 Atherosclerosis of aorta: Secondary | ICD-10-CM

## 2020-12-13 DIAGNOSIS — R140 Abdominal distension (gaseous): Secondary | ICD-10-CM

## 2020-12-13 DIAGNOSIS — Z23 Encounter for immunization: Secondary | ICD-10-CM

## 2020-12-13 DIAGNOSIS — E785 Hyperlipidemia, unspecified: Secondary | ICD-10-CM

## 2020-12-13 DIAGNOSIS — R0789 Other chest pain: Secondary | ICD-10-CM

## 2020-12-13 DIAGNOSIS — R0600 Dyspnea, unspecified: Secondary | ICD-10-CM | POA: Diagnosis not present

## 2020-12-13 DIAGNOSIS — R7301 Impaired fasting glucose: Secondary | ICD-10-CM

## 2020-12-13 DIAGNOSIS — I10 Essential (primary) hypertension: Secondary | ICD-10-CM

## 2020-12-13 LAB — CBC
Hematocrit: 44.8 % (ref 37.5–51.0)
Hemoglobin: 15 g/dL (ref 13.0–17.7)
MCH: 29.2 pg (ref 26.6–33.0)
MCHC: 33.5 g/dL (ref 31.5–35.7)
MCV: 87 fL (ref 79–97)
Platelets: 288 10*3/uL (ref 150–450)
RBC: 5.13 x10E6/uL (ref 4.14–5.80)
RDW: 13 % (ref 11.6–15.4)
WBC: 6.1 10*3/uL (ref 3.4–10.8)

## 2020-12-13 LAB — COMPREHENSIVE METABOLIC PANEL
ALT: 20 IU/L (ref 0–44)
AST: 25 IU/L (ref 0–40)
Albumin/Globulin Ratio: 1.8 (ref 1.2–2.2)
Albumin: 4.4 g/dL (ref 3.8–4.8)
Alkaline Phosphatase: 88 IU/L (ref 44–121)
BUN/Creatinine Ratio: 12 (ref 10–24)
BUN: 11 mg/dL (ref 8–27)
Bilirubin Total: 0.7 mg/dL (ref 0.0–1.2)
CO2: 28 mmol/L (ref 20–29)
Calcium: 9.8 mg/dL (ref 8.6–10.2)
Chloride: 101 mmol/L (ref 96–106)
Creatinine, Ser: 0.89 mg/dL (ref 0.76–1.27)
Globulin, Total: 2.4 g/dL (ref 1.5–4.5)
Glucose: 113 mg/dL — ABNORMAL HIGH (ref 70–99)
Potassium: 4 mmol/L (ref 3.5–5.2)
Sodium: 141 mmol/L (ref 134–144)
Total Protein: 6.8 g/dL (ref 6.0–8.5)
eGFR: 94 mL/min/{1.73_m2} (ref >59)

## 2020-12-13 LAB — HEMOGLOBIN A1C
Est. average glucose Bld gHb Est-mCnc: 134 mg/dL
Hgb A1c MFr Bld: 6.3 % — ABNORMAL HIGH (ref 4.8–5.6)

## 2020-12-13 NOTE — Progress Notes (Signed)
Subjective:  Luke Orr is a 70 y.o. male who presents for Chief Complaint  Patient presents with   Bloated    Gas and bloating for a couple months. No pain in stomach.      Here for concern of a rumbling in his chest.  I saw him in September more for constipation and gas and bloating.  However today he says his symptoms feel like a rumbling in his left chest.  He did not describe it as a palpitation but from what he is showing with his hands it may be suggestive of palpitation.  He also feels at times a little difficulty breathing.  This is worse after eating often but not necessarily worse with activity or exercise.  He denies any spasm of the muscle in the chest.  No pain to the arm or jaw.  No dizziness or faint feeling.  He does eat a good amount of hot sauce.  Drinks about a glass of wine per night.  Former smoker.  No nausea or vomiting, no recent bowel issues.  He does eat a lot of tomato-based foods but not a lot of other citrus.  He describes some general weakness but not in a particular part of the body.    Last visit he was describing gas pains and bloating for about 2 months.  Tried gas x, Rolaids, pepto and nothing seems to help.  Having 1 or more BM daily.   No diet changes.    Hypertension-compliant with medications  Hyperlipidemia-compliant with aspirin and cholesterol medication without complaint  No other aggravating or relieving factors.    No other c/o.  Past Medical History:  Diagnosis Date   Abnormal abdominal ultrasound 03/2018   no AAA, atherosclerosis present   Arthritis    right knee   BPH (benign prostatic hypertrophy)    with urinary urgency and weak stream, followed by Dr. Bjorn Pippin, [facility]   Bulging lumbar disc    self reported   DDD (degenerative disc disease), lumbar    Elevated PSA    Dr. Annabell Howells, [facility]   Full dentures    H/O echocardiogram 03/2018   [facility], mod concetric LVH, EF 58%. mild left atrial  enlargement   Hyperlipidemia    Hypertension    Impaired fasting blood sugar    Obesity    Obesity    Past Surgical History:  Procedure Laterality Date   COLONOSCOPY  2015   mild diverticulosis, repeat 2025; Dr. Deberah Castle HERNIA REPAIR Right 1995   PROSTATE BIOPSY  2014   x 2; Dr. Annabell Howells    The following portions of the patient's history were reviewed and updated as appropriate: allergies, current medications, past family history, past medical history, past social history, past surgical history and problem list.  ROS Otherwise as in subjective above    Objective: BP 130/80   Pulse 65   Wt 231 lb 12.8 oz (105.1 kg)   BMI 36.31 kg/m   Wt Readings from Last 3 Encounters:  12/13/20 231 lb 12.8 oz (105.1 kg)  11/03/20 233 lb 9.6 oz (106 kg)  02/24/20 229 lb 6.4 oz (104.1 kg)   BP Readings from Last 3 Encounters:  12/13/20 130/80  11/03/20 (!) 150/84  02/24/20 128/78    General appearance: alert, no distress, well developed, well nourished Neck: supple, no lymphadenopathy, no thyromegaly, no masses, no bruits Heart: RRR, normal S1, S2, no murmurs Lungs: CTA bilaterally, no wheezes, rhonchi, or rales Abdomen: +  bs, soft, non tender, non distended, no masses, no hepatomegaly, no splenomegaly Pulses: 2+ radial pulses, 2+ pedal pulses, normal cap refill Ext: no edema   EKG-indication palpitation, hypertension, rate 59 bpm, PR 176 ms, QRS 118 ms, QTC 445 ms, axis 50 degrees, sinus bradycardia, nonspecific intraventricular conduction delay, no acute change.     Assessment: Encounter Diagnoses  Name Primary?   Chest discomfort Yes   Dyspnea, unspecified type    Bloating    Atherosclerosis of aorta (HCC)    Hyperlipidemia, unspecified hyperlipidemia type    Essential hypertension    Impaired fasting blood sugar    COVID-19 vaccine administered       Plan: Discussed symptoms and concerns.  At his last visit in September for bloating and gas we had him  try MiraLAX, hydration, and avoidance of gas producing foods.  He notes no real improvement.  His symptoms today are little different than last visit with more of a flutter or palpitation concern.  EKG reviewed  labs today, consider event monitor or other evaluation.  He had an echocardiogram January 2020 showing moderate concentric hypertrophy of left ventricle, normal global wall function, EF 58%, left atrial cavity mildly dilated, trace aortic regurgitation trace mitral regurgitation  Hypertension-continue current medication, labs today  Hyperlipidemia-continue current medication  Impaired glucose-updated labs today  Bloating-not sure this still may be gastric related.  Advised he avoid big portions, gas causing foods, and use recommendations discussed last time including MiraLAX and acid reflux medication short-term  Counseled on the Covid virus vaccine.  Vaccine information sheet given.  Covid vaccine given after consent obtained.   Bernie was seen today for bloated.  Diagnoses and all orders for this visit:  Chest discomfort -     EKG 12-Lead -     CBC -     Comprehensive metabolic panel  Dyspnea, unspecified type -     EKG 12-Lead -     CBC -     Comprehensive metabolic panel  Bloating -     CBC -     Comprehensive metabolic panel  Atherosclerosis of aorta (HCC)  Hyperlipidemia, unspecified hyperlipidemia type  Essential hypertension  Impaired fasting blood sugar -     Hemoglobin A1c  COVID-19 vaccine administered -     Moderna Covid-19 Vaccine Bivalent Booster    Follow up: pending labs

## 2020-12-14 ENCOUNTER — Other Ambulatory Visit: Payer: Self-pay | Admitting: Medical

## 2020-12-14 DIAGNOSIS — R0789 Other chest pain: Secondary | ICD-10-CM

## 2020-12-14 DIAGNOSIS — I10 Essential (primary) hypertension: Secondary | ICD-10-CM

## 2020-12-14 DIAGNOSIS — R140 Abdominal distension (gaseous): Secondary | ICD-10-CM

## 2020-12-14 MED ORDER — ASPIRIN EC 81 MG PO TBEC: 81.0000 mg | DELAYED_RELEASE_TABLET | Freq: Every day | ORAL | 3 refills | Status: DC

## 2020-12-14 MED ORDER — ROSUVASTATIN CALCIUM 20 MG PO TABS: 20.0000 mg | ORAL_TABLET | Freq: Every day | ORAL | 3 refills | Status: DC

## 2020-12-14 MED ORDER — OMEPRAZOLE 40 MG PO CPDR: 40.0000 mg | DELAYED_RELEASE_CAPSULE | Freq: Every day | ORAL | 0 refills | Status: DC

## 2020-12-14 MED ORDER — AMLODIPINE BESYLATE 10 MG PO TABS: 10.0000 mg | ORAL_TABLET | Freq: Every day | ORAL | 3 refills | Status: DC

## 2020-12-14 MED ORDER — LISINOPRIL-HYDROCHLOROTHIAZIDE 20-12.5 MG PO TABS: 1.0000 | ORAL_TABLET | Freq: Every day | ORAL | 3 refills | Status: DC

## 2020-12-14 MED ORDER — POLYETHYLENE GLYCOL 3350 17 G PO PACK: 17.0000 g | PACK | Freq: Every day | ORAL | 0 refills | Status: DC

## 2020-12-14 NOTE — Progress Notes (Signed)
Happy to see. Staff, please follow up.  Thanks MJP

## 2020-12-16 ENCOUNTER — Encounter: Payer: Self-pay | Admitting: Internal Medicine

## 2020-12-29 ENCOUNTER — Other Ambulatory Visit: Payer: Self-pay | Admitting: Medical

## 2021-01-11 ENCOUNTER — Telehealth: Payer: Self-pay | Admitting: Medical

## 2021-01-11 NOTE — Telephone Encounter (Signed)
Spoke with patient to schedule Medicare Annual Wellness Visit (AWV) either virtually or in office.  Patient stated insurance came to home   Last AWV 09/24/19  please schedule at anytime with health coach  This should be a 45 minute visit.

## 2021-01-13 ENCOUNTER — Other Ambulatory Visit: Payer: Self-pay

## 2021-01-13 ENCOUNTER — Ambulatory Visit: Payer: Medicare Other | Admitting: Cardiology

## 2021-01-13 ENCOUNTER — Encounter: Payer: Self-pay | Admitting: Cardiology

## 2021-01-13 ENCOUNTER — Inpatient Hospital Stay: Payer: Medicare Other

## 2021-01-13 VITALS — BP 155/87 | HR 77 | Temp 97.8°F | Resp 17 | Ht 67.0 in | Wt 228.0 lb

## 2021-01-13 DIAGNOSIS — M79604 Pain in right leg: Secondary | ICD-10-CM

## 2021-01-13 DIAGNOSIS — R002 Palpitations: Secondary | ICD-10-CM

## 2021-01-13 DIAGNOSIS — I10 Essential (primary) hypertension: Secondary | ICD-10-CM

## 2021-01-13 NOTE — Progress Notes (Addendum)
Follow up visit  Subjective:   Luke Orr, male    DOB: Sep 12, 1950, 71 y.o.   MRN: [id number]     HPI  Chief Complaint  Patient presents with   Palpitations   Follow-up    70 y.o. African American male with hypertension, hyperlipidemia, prediabetes, former smoker, family history of coronary artery disease.  Patient was last seen by me in 2020.  Patient has been experiencing "rumble" in his chest for last 2 months.  Episodes occur unrelated to exertion, last off and on for several minutes.  He denies any associated chest pain or shortness of breath symptoms.  Blood pressure is elevated today, but reportedly normal at home and always elevated at doctor's office.  He reports fatigue in both his legs, as well as pain in his right eye for last several days.  Fatigue is worse on exertion, pain is present unrelated to exertion.   Current Outpatient Medications on File Prior to Visit  Medication Sig Dispense Refill   amLODipine (NORVASC) 10 MG tablet Take 1 tablet (10 mg total) by mouth daily. 90 tablet 3   aspirin EC 81 MG tablet Take 1 tablet (81 mg total) by mouth daily. 90 tablet 3   cholecalciferol (VITAMIN D3) 25 MCG (1000 UNIT) tablet Take 1,000 Units by mouth daily.     lisinopril-hydrochlorothiazide (ZESTORETIC) 20-12.5 MG tablet Take 1 tablet by mouth daily. 90 tablet 3   rosuvastatin (CRESTOR) 20 MG tablet Take 1 tablet (20 mg total) by mouth daily. 90 tablet 3   vitamin E 1000 UNIT capsule Take 1,000 Units by mouth daily.     No current facility-administered medications on file prior to visit.    Cardiovascular & other pertient studies:   EKG 12/13/2020: Sinus rhythm Normal EKG  Echocardiogram 03/2018: Moderate concentric LVH.  EF 58%.  Normal diastolic filling pattern. Mild left atrial dilatation. Trace mitral and tricuspid regurgitation.  Aorta duplex 03/2018: No AAA. Diameter 2.03 cm. Diffuse mild calcific plaque in proximal, mid, and distal aorta.   Normal flow velocities noted in the aorta and bilateral iliac arteries.   Recent labs: 12/13/2020: Glucose 113, BUN/Cr 11/0.89. EGFR 94. Na/K 141/4.0. Rest of the CMP normal H/H 15/44. MCV 87. Platelets 288 HbA1C 6.3%  09/2019: Chol 171, TG 83, HDL 54, LDL 102    Review of Systems  Cardiovascular:  Positive for claudication and palpitations. Negative for chest pain, dyspnea on exertion, leg swelling and syncope.       Vitals:   01/13/21 0939  BP: (!) 155/87  Pulse: 77  Resp: 17  Temp: 97.8 F (36.6 C)  SpO2: 98%    Body mass index is 35.71 kg/m. Filed Weights   01/13/21 0939  Weight: 228 lb (103.4 kg)     Objective:   Physical Exam Vitals and nursing note reviewed.  Constitutional:      General: He is not in acute distress. Neck:     Vascular: No JVD.  Cardiovascular:     Rate and Rhythm: Normal rate and regular rhythm.     Pulses:          Dorsalis pedis pulses are 0 on the right side and 0 on the left side.       Posterior tibial pulses are 1+ on the right side and 1+ on the left side.     Heart sounds: Murmur heard.  High-pitched blowing holosystolic murmur is present with a grade of 2/6 at the apex.  Pulmonary:  Effort: Pulmonary effort is normal.     Breath sounds: Normal breath sounds. No wheezing or rales.  Musculoskeletal:     Right lower leg: No edema.     Left lower leg: No edema.          Assessment & Recommendations:   70 y.o. African American male with hypertension, hyperlipidemia, prediabetes, former smoker, family history of coronary artery disease.  Palpitations: Differentials include tachyarrhythmias like A. fib.  Recommend echocardiogram and 2-week cardiac telemetry.  Hypertension: Suspect component of whitecoat hypertension.  No change made today.  Consider sleep study, defer to PCP.  Bilateral leg pain, right thigh pain: Diminished distal pulses on exam.  Suspect claudication, will obtain lower extremity duplex  ultrasound.  In addition, he has had right thigh pain unrelated to exertion.  We will also obtain DVT study for right leg.  Follow-up after tests     Nigel Mormon, MD Pager: [phone] Office: [phone]

## 2021-01-30 ENCOUNTER — Ambulatory Visit: Payer: Medicare Other

## 2021-01-30 ENCOUNTER — Other Ambulatory Visit: Payer: Self-pay

## 2021-01-30 DIAGNOSIS — M79605 Pain in left leg: Secondary | ICD-10-CM

## 2021-01-30 DIAGNOSIS — I10 Essential (primary) hypertension: Secondary | ICD-10-CM

## 2021-01-30 DIAGNOSIS — I77810 Thoracic aortic ectasia: Secondary | ICD-10-CM

## 2021-01-30 DIAGNOSIS — R0989 Other specified symptoms and signs involving the circulatory and respiratory systems: Secondary | ICD-10-CM

## 2021-01-30 DIAGNOSIS — M79604 Pain in right leg: Secondary | ICD-10-CM

## 2021-02-26 IMAGING — MR MR PROSTATE WO/W CM
12 series · 48 of 48 positions shown · IV contrast (multihance)
Comparison: None

CLINICAL DATA: History of elevated PSA. The latest PSA of 13.4 on
09/24/2019. Prior biopsy from 6356

EXAM:
MR PROSTATE WITHOUT AND WITH CONTRAST
TECHNIQUE: Multiplanar multisequence MRI images were obtained of the pelvis
centered about the prostate. Pre and post contrast images were
obtained.
CONTRAST:  20mL MULTIHANCE GADOBENATE DIMEGLUMINE 529 MG/ML IV SOLN

[Series 3: T2 · coronal · 3.0mm · 0.56mm/px · 1 of 25 slices shown (1 of 3)]
[im 1/25]
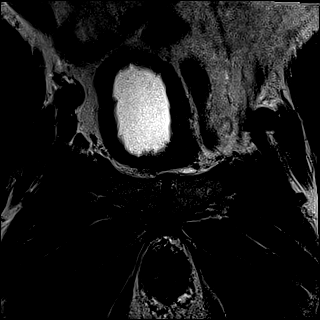

[Series 4: T1 · axial · 5.0mm · 1.25mm/px · 1 of 80 slices shown]
[im 1/80]
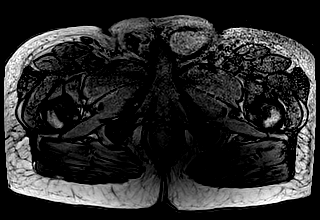

[Series 5: DWI · axial · 3.0mm · 1.75mm/px · 1 of 75 slices shown (1 of 3)]
[im 1/75]
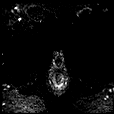

[Series 6: DWI · axial · 3.0mm · 1.75mm/px · 1 of 25 slices shown (2 of 3)]
[im 1/25]
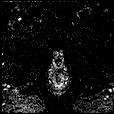

[Series 7: DWI · axial · 3.0mm · 1.75mm/px · 1 of 25 slices shown (3 of 3)]
[im 1/25]
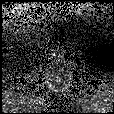

[Series 8: T2 · axial · 3.0mm · 0.56mm/px · 1 of 26 slices shown (2 of 3)]
[im 1/26]
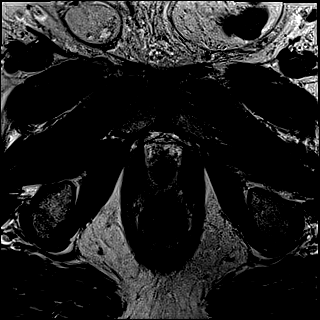

[Series 9: T2 · axial · 1.0mm · 1.04mm/px · z∈[+4,+99]mm · 2 of 96 slices shown (3 of 3)]
[im 1/96]
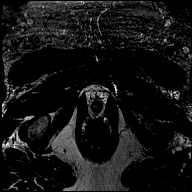
[im 96/96]
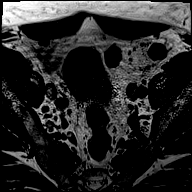

[Series 10: pre t1_twist_tra_dyn · axial · non-contrast · 3.5mm · 0.83mm/px · 1 of 24 slices shown]
[im 1/24]
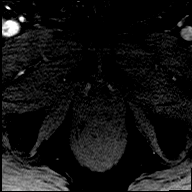

[Series 11: post t1_twist_tra_dyn-copy center · axial · non-contrast · 3.5mm · 0.83mm/px · z∈[+4,+84]mm · 18 of 720 slices shown]
[im 1/720]
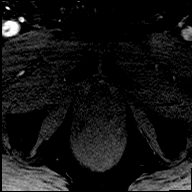
[im 43/720]
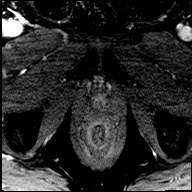
[im 85/720]
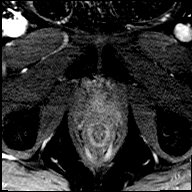
[im 127/720]
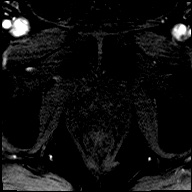
[im 170/720]
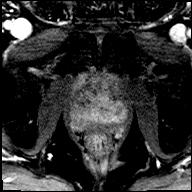
[im 212/720]
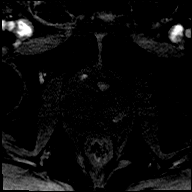
[im 254/720]
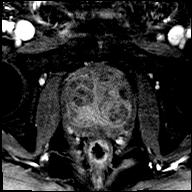
[im 297/720]
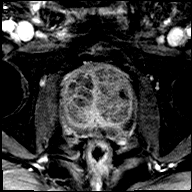
[im 339/720]
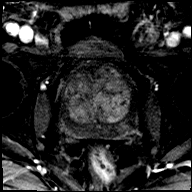
[im 381/720]
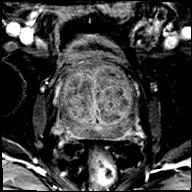
[im 423/720]
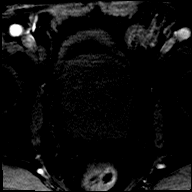
[im 466/720]
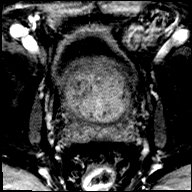
[im 508/720]
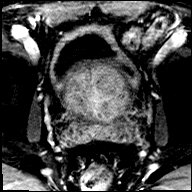
[im 550/720]
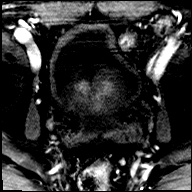
[im 593/720]
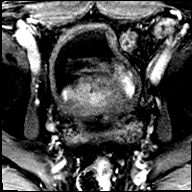
[im 635/720]
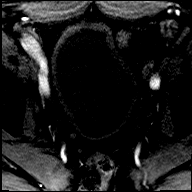
[im 677/720]
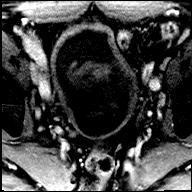
[im 720/720]
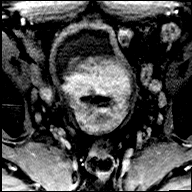

[Series 12: post t1_twist_tra_dyn-copy cent_sub · axial · 3.5mm · 0.83mm/px · z∈[+4,+84]mm · 17 of 696 slices shown]
[im 1/696]
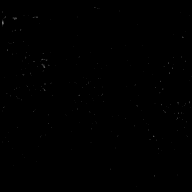
[im 44/696]
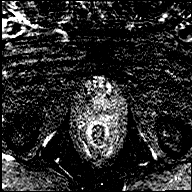
[im 87/696]
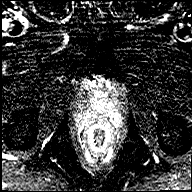
[im 131/696]
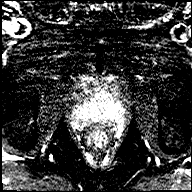
[im 174/696]
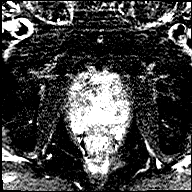
[im 218/696]
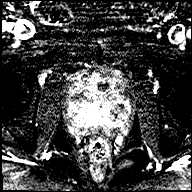
[im 261/696]
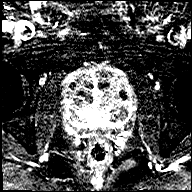
[im 305/696]
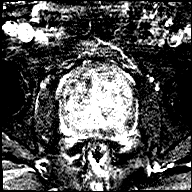
[im 348/696]
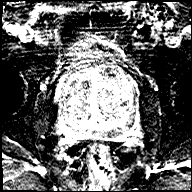
[im 391/696]
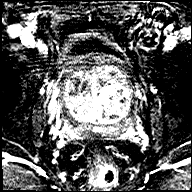
[im 435/696]
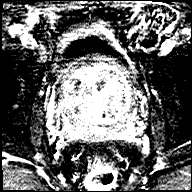
[im 478/696]
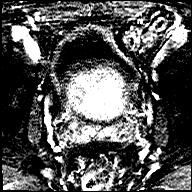
[im 522/696]
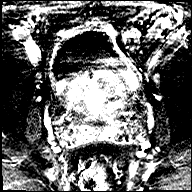
[im 565/696]
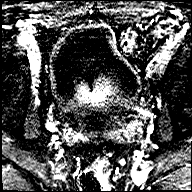
[im 609/696]
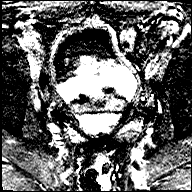
[im 652/696]
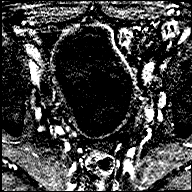
[im 696/696]
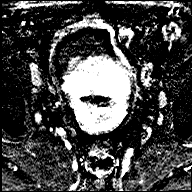

[Series 13: t1_vibe_dixon_tra_f · axial · 2.5mm · 0.91mm/px · z∈[-13,+185]mm · 2 of 80 slices shown]
[im 1/80]
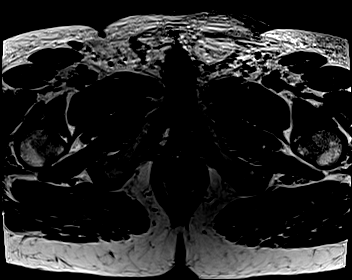
[im 80/80]
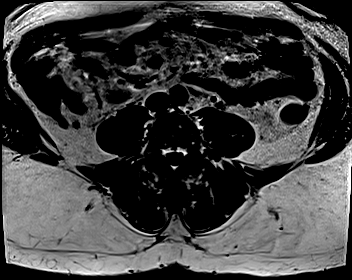

[Series 14: t1_vibe_dixon_tra_w · axial · 2.5mm · 0.91mm/px · z∈[-13,+185]mm · 2 of 80 slices shown]
[im 1/80]
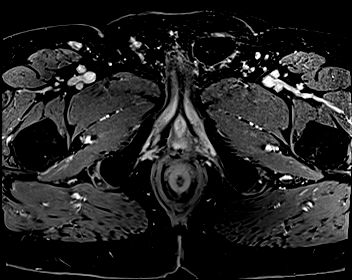
[im 80/80]
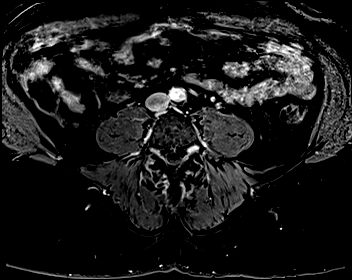

[48 of 48 positions shown; findings below may reference images not displayed]

FINDINGS: Prostate: Prostatomegaly with heterogeneous prostate.

Peripheral zone: Wedge-shaped and linear areas of T2 hypointensity
throughout the peripheral zone. No focal area of restricted
diffusion to indicate high-risk lesion in the peripheral zone.

Transitional zone: Marked hypertrophy of the transitional zone with
resultant thinning of the peripheral zone. Numerous BPH nodules. No
high-risk lesion.

Volume: 6.3 x 5.9 x 6.7 cm with volume of 130 cc. Calculated PSA
density based on current PSA is 0.1 nanogram/cc

Transcapsular spread:  Absent

Seminal vesicle involvement: Absent

Neurovascular bundle involvement: Absent

Pelvic adenopathy: Absent

Bone metastasis: Absent

Other findings: Moderately large LEFT inguinal hernia contains
portion of the sigmoid colon on many of the submitted images. A
small to moderate RIGHT inguinal hernia contains fat. Colonic
diverticulosis.
IMPRESSION: 1. No high-risk lesion in the prostate. Overall assessment PIRADS
category 2.
2. Changes of BPH and presumed changes of prostatitis as discussed.
3. LEFT inguinal hernia containing a portion of the sigmoid colon.
No signs of obstruction or pericolonic stranding

## 2021-06-29 ENCOUNTER — Other Ambulatory Visit: Payer: Self-pay | Admitting: Medical

## 2021-07-11 ENCOUNTER — Ambulatory Visit (INDEPENDENT_AMBULATORY_CARE_PROVIDER_SITE_OTHER): Payer: Medicare Other | Admitting: Medical

## 2021-07-11 ENCOUNTER — Telehealth: Payer: Self-pay | Admitting: Internal Medicine

## 2021-07-11 VITALS — BP 150/90 | HR 64 | Temp 96.8°F | Wt 218.6 lb

## 2021-07-11 DIAGNOSIS — R519 Headache, unspecified: Secondary | ICD-10-CM | POA: Diagnosis not present

## 2021-07-11 DIAGNOSIS — I700 Atherosclerosis of aorta: Secondary | ICD-10-CM

## 2021-07-11 DIAGNOSIS — R972 Elevated prostate specific antigen [PSA]: Secondary | ICD-10-CM

## 2021-07-11 DIAGNOSIS — R7301 Impaired fasting glucose: Secondary | ICD-10-CM

## 2021-07-11 DIAGNOSIS — I10 Essential (primary) hypertension: Secondary | ICD-10-CM

## 2021-07-11 DIAGNOSIS — Z7689 Persons encountering health services in other specified circumstances: Secondary | ICD-10-CM

## 2021-07-11 DIAGNOSIS — E785 Hyperlipidemia, unspecified: Secondary | ICD-10-CM

## 2021-07-11 NOTE — Telephone Encounter (Signed)
Pt is scheduled for tomorrow 07/12/21 for sleep Evaluation tomorrow 07/12/21 at 11:15am arrive at 10:45am at [facility] ?Address: [address], [place], [place] [address] ?Phone: [phone] ?

## 2021-07-11 NOTE — Addendum Note (Signed)
Addended by: Herminio Commons A on: 07/11/2021 11:13 AM ? ? Modules accepted: Orders ? ?

## 2021-07-11 NOTE — Progress Notes (Signed)
Subjective: ? Luke Orr is a 71 y.o. male who presents for ?Chief Complaint  ?Patient presents with  ? headache  ?  Usually always wakes up with headache but yesterday was rough and finally went away last night but feels slight one today  ?   ?Here for complaint of headache.  Had real bad headache yesterday.   Been having some mild morning headaches for about a month.  Usually can walk off the headache but yesterday was feeling bad all day, had some nausea . No numbness, no tingling, no weakness.  No vision changes, no slurred speech.  Has had some dizzy spells.    ? ?No report of loud snoring or witnessed apnea.  Doesn't feel rested in mornings.   ? ?High blood pressure-compliant with lisinopril HCT 20/12.5 mg daily.  Quit amlodipine on his own feeling dizzy.  Quit this a few months ago.  BP at home typically 120/70s.   ? ?He has history of elevated PSA with prior biopsies that were negative for cancer.  Last urology consult visit was over a year ago ? ?Back in the fall his blood sugars were elevated.  He says around that time his wife made a really big cake and he was eating about a piece 3 times a day.  He has cut out sweets since last visit and has lost 10 pounds. ? ?Has some urinary frequency.   ? ?No prior sleep study.  ? ?No other aggravating or relieving factors.   ? ?No other c/o. ? ?Past Medical History:  ?Diagnosis Date  ? Abnormal abdominal ultrasound 03/2018  ? no AAA, atherosclerosis present  ? Arthritis   ? right knee  ? BPH (benign prostatic hypertrophy)   ? with urinary urgency and weak stream, followed by Dr. Bjorn Pippin, [facility]  ? Bulging lumbar disc   ? self reported  ? DDD (degenerative disc disease), lumbar   ? Elevated PSA   ? Dr. Annabell Howells, [facility]  ? Full dentures   ? H/O echocardiogram 03/2018  ? [facility], mod concetric LVH, EF 58%. mild left atrial enlargement  ? Hyperlipidemia   ? Hypertension   ? Impaired fasting blood sugar   ? Obesity   ? Obesity    ? ?Current Outpatient Medications on File Prior to Visit  ?Medication Sig Dispense Refill  ? amLODipine (NORVASC) 10 MG tablet Take 1 tablet (10 mg total) by mouth daily. 90 tablet 3  ? aspirin EC 81 MG tablet Take 1 tablet (81 mg total) by mouth daily. 90 tablet 3  ? cholecalciferol (VITAMIN D3) 25 MCG (1000 UNIT) tablet Take 1,000 Units by mouth daily.    ? lisinopril-hydrochlorothiazide (ZESTORETIC) 20-12.5 MG tablet Take 1 tablet by mouth daily. 90 tablet 3  ? rosuvastatin (CRESTOR) 20 MG tablet Take 1 tablet (20 mg total) by mouth daily. 90 tablet 3  ? vitamin E 1000 UNIT capsule Take 1,000 Units by mouth daily.    ? ?No current facility-administered medications on file prior to visit.  ? ? ? ?The following portions of the patient's history were reviewed and updated as appropriate: allergies, current medications, past family history, past medical history, past social history, past surgical history and problem list. ? ?ROS ?Otherwise as in subjective above ? ?Objective: ?BP (!) 150/90   Pulse 64   Temp (!) 96.8 ?F (36 ?C)   Wt 218 lb 9.6 oz (99.2 kg)   BMI 34.24 kg/m?  ? ?Wt Readings from Last 3 Encounters:  ?  07/11/21 218 lb 9.6 oz (99.2 kg)  ?01/13/21 228 lb (103.4 kg)  ?12/13/20 231 lb 12.8 oz (105.1 kg)  ? ?BP Readings from Last 3 Encounters:  ?07/11/21 (!) 150/90  ?01/13/21 (!) 155/87  ?12/13/20 130/80  ? ? ?General appearance: alert, no distress, well developed, well nourished ?HEENT: normocephalic, sclerae anicteric, conjunctiva pink and moist, nares patent, no discharge or erythema, pharynx normal ?Oral cavity: MMM, no lesions ?Neck: supple, no lymphadenopathy, no thyromegaly, no masses, no bruits ?Heart: RRR, normal S1, S2, no murmurs ?Lungs: CTA bilaterally, no wheezes, rhonchi, or rales ?Pulses: 2+ radial pulses, 2+ pedal pulses, normal cap refill ?Ext: no edema ?Neuro: CN II through XII intact, nonfocal exam, alert and oriented x3, strength and sensation seem normal throughout, Romberg  negative. ? ? ?Assessment: ?Encounter Diagnoses  ?Name Primary?  ? Frequent headaches Yes  ? Impaired fasting blood sugar   ? Elevated PSA   ? Essential hypertension   ? Hyperlipidemia, unspecified hyperlipidemia type   ? Atherosclerosis of aorta (HCC)   ? ? ? ?Plan: ?We discussed his new headaches.  We discussed several possible differential.  Of note he has a home blood pressure cuff and home readings have been normal.  In the past he has brought his cuff in to compare with ours and it was pretty accurate.  Labs as below, MRI will be ordered as below ? ?I suspect there could be some sleep apnea.  We are going to pursue sleep evaluation. ? ?Hypertension-he stopped amlodipine on his own as his pressures were running low a few months ago.  He is currently compliant with lisinopril HCT. ? ?Hyperlipidemia, atherosclerosis-continue statin ? ?Elevated PSA-labs today but advised that he needs to go back and do a follow-up with urology ? ?Congratulated him on his weight loss and healthier diet in recent months ? ?Impaired glucose-updated labs today ? ? ? ?Luke Orr was seen today for headache. ? ?Diagnoses and all orders for this visit: ? ?Frequent headaches ?-     MR Brain Wo Contrast; Future ? ?Impaired fasting blood sugar ?-     Basic metabolic panel ?-     Hemoglobin A1c ? ?Elevated PSA ?-     PSA ? ?Essential hypertension ?-     Basic metabolic panel ?-     MR Brain Wo Contrast; Future ? ?Hyperlipidemia, unspecified hyperlipidemia type ? ?Atherosclerosis of aorta (HCC) ? ? ? ?Follow up: Pending labs, sleep study ?

## 2021-07-11 NOTE — Patient Instructions (Signed)
Headaches can be caused by many different reasons including too much caffeine, dietary changes, blood pressure fluctuations can potentially be related, but also scary things like tumors or decreased blood flow, stress, not sleeping well. ? ?I suspect you could have sleep apnea.  Sleep apnea symptoms include frequent headaches or new headaches, non restful sleep, fatigue, as well as high blood pressure loud snoring and witnessed apnea. ? ?We are going to refer you for sleep evaluation ? ?In the meantime continue to monitor your blood pressures and continue your current medications.  Limit salt and caffeine.  Try to get 7 to 8 hours of sleep nightly.  Consider sleeping in a recliner or more recliner instead of flat on your back. ? ?We will call with lab results. ? ?Please go ahead and schedule follow-up with your urologist for elevated PSA ? ?Expect phone calls about sleep evaluation and MRI brain.  I want to check an MRI of your brain to make sure no obvious worrisome causes of headaches ? ?In the meantime you can use either Tylenol or Excedrin when you get a bad headache.  Avoid ibuprofen Aleve Advil or Motrin for headaches as these can worsen blood pressure ?

## 2021-07-12 ENCOUNTER — Encounter: Payer: Self-pay | Admitting: Neurology

## 2021-07-12 ENCOUNTER — Ambulatory Visit: Payer: Medicare Other | Admitting: Neurology

## 2021-07-12 ENCOUNTER — Other Ambulatory Visit: Payer: Self-pay | Admitting: Medical

## 2021-07-12 VITALS — BP 172/87 | HR 63 | Ht 67.0 in | Wt 219.0 lb

## 2021-07-12 DIAGNOSIS — G478 Other sleep disorders: Secondary | ICD-10-CM

## 2021-07-12 DIAGNOSIS — R519 Headache, unspecified: Secondary | ICD-10-CM | POA: Diagnosis not present

## 2021-07-12 DIAGNOSIS — R972 Elevated prostate specific antigen [PSA]: Secondary | ICD-10-CM

## 2021-07-12 DIAGNOSIS — E669 Obesity, unspecified: Secondary | ICD-10-CM

## 2021-07-12 DIAGNOSIS — R0683 Snoring: Secondary | ICD-10-CM | POA: Diagnosis not present

## 2021-07-12 DIAGNOSIS — R351 Nocturia: Secondary | ICD-10-CM

## 2021-07-12 DIAGNOSIS — R030 Elevated blood-pressure reading, without diagnosis of hypertension: Secondary | ICD-10-CM

## 2021-07-12 DIAGNOSIS — G4719 Other hypersomnia: Secondary | ICD-10-CM | POA: Diagnosis not present

## 2021-07-12 LAB — BASIC METABOLIC PANEL
BUN/Creatinine Ratio: 14 (ref 10–24)
BUN: 12 mg/dL (ref 8–27)
CO2: 26 mmol/L (ref 20–29)
Calcium: 9.8 mg/dL (ref 8.6–10.2)
Chloride: 99 mmol/L (ref 96–106)
Creatinine, Ser: 0.86 mg/dL (ref 0.76–1.27)
Glucose: 130 mg/dL — ABNORMAL HIGH (ref 70–99)
Potassium: 3.7 mmol/L (ref 3.5–5.2)
Sodium: 139 mmol/L (ref 134–144)
eGFR: 94 mL/min/{1.73_m2} (ref >59)

## 2021-07-12 LAB — HEMOGLOBIN A1C
Est. average glucose Bld gHb Est-mCnc: 123 mg/dL
Hgb A1c MFr Bld: 5.9 % — ABNORMAL HIGH (ref 4.8–5.6)

## 2021-07-12 LAB — PSA: Prostate Specific Ag, Serum: 15.4 ng/mL — ABNORMAL HIGH (ref 0.0–4.0)

## 2021-07-12 NOTE — Patient Instructions (Signed)

## 2021-07-12 NOTE — Progress Notes (Signed)
Subjective:  ?  ?Patient ID: Luke Orr is a 71 y.o. male. ? ?HPI ? ? ? ?Star Age, MD, PhD ?[facility] ?[place], [address] ?[phone] ?[place], [place] [address] ? ?Dear Audelia Acton,   ? ?I saw your patient, Luke Orr, upon your kind request, in my Sleep clinic today for initial consultation of his sleep disorder, in particular, concern for underlying obstructive sleep apnea.  The patient is unaccompanied today.  As you know, Luke Orr is a 71 year old right-handed gentleman with an underlying medical history of hypertension, BPH, degenerative disc disease, elevated PSA, hyperlipidemia, impaired fasting blood sugar, and obesity, who reports snoring and excessive daytime somnolence, recurrent headaches as well as nonrestorative sleep.  I reviewed your office note from 07/11/2021.  His Epworth sleepiness score is 7 out of 24, fatigue severity score is 18 out of 63.  In the past month or so he has had occasional morning headaches which are often self-limiting and improve after he eats breakfast, by late morning the headache is typically gone.  He has nocturia about once per average night.  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  He has 3 grown daughters, younger daughter is currently staying with them.  They have no pets in the household.  He does have a TV in the bedroom and it tends to stay on all night.  He drinks caffeine in the form of diet Pepsi, about 2 cans/day, decaf coffee in the morning.  He is working on weight loss and sugar reduction.  He has lost about 10 pounds within the past 4 months.  He quit smoking some 10 years ago, he drinks alcohol in the form of wine, nearly every evening, he reports that it helps him sleep.  He goes to bed around midnight or 1 AM and rise time is around 8 AM.  He does not have a family history of sleep apnea.  His blood pressure tends to be elevated when he goes to the doctors.  At  home, his blood pressure tends to be better. ? ?His Past Medical History Is Significant For: ?Past Medical History:  ?Diagnosis Date  ? Abnormal abdominal ultrasound 03/2018  ? no AAA, atherosclerosis present  ? Arthritis   ? right knee  ? BPH (benign prostatic hypertrophy)   ? with urinary urgency and weak stream, followed by Dr. Irine Seal, [facility]  ? Bulging lumbar disc   ? self reported  ? DDD (degenerative disc disease), lumbar   ? Elevated PSA   ? Dr. Jeffie Pollock, [facility]  ? Full dentures   ? H/O echocardiogram 03/2018  ? [facility], mod concetric LVH, EF 58%. mild left atrial enlargement  ? Hyperlipidemia   ? Hypertension   ? Impaired fasting blood sugar   ? Obesity   ? Obesity   ? ? ?His Past Surgical History Is Significant For: ?Past Surgical History:  ?Procedure Laterality Date  ? COLONOSCOPY  2015  ? mild diverticulosis, repeat 2025; Dr. Carlean Purl  ? INGUINAL HERNIA REPAIR Right 1995  ? PROSTATE BIOPSY  2014  ? x 2; Dr. Jeffie Pollock  ? ? ?His Family History Is Significant For: ?Family History  ?Problem Relation Age of Onset  ? Heart disease Mother   ? Diabetes Mother   ? Hypertension Mother   ? Heart disease Father   ? Hypertension Sister   ? Diabetes Sister   ? Hypertension  Sister   ? Diabetes Sister   ? Hypertension Sister   ? Diabetes Sister   ? Hypertension Sister   ? Hypertension Sister   ? Aneurysm Sister   ?     brain  ? Hypertension Brother   ? Cancer Brother 56  ?     prostate  ? Heart disease Brother 39  ?     CABG, died of CHF  ? Hypertension Brother   ? Diabetes Brother   ? Colon cancer Neg Hx   ? Stroke Neg Hx   ? Sleep apnea Neg Hx   ? ? ?His Social History Is Significant For: ?Social History  ? ?Socioeconomic History  ? Marital status: Married  ?  Spouse name: Not on file  ? Number of children: 3  ? Years of education: Not on file  ? Highest education level: Not on file  ?Occupational History  ? Not on file  ?Tobacco Use  ? Smoking status: Former  ?  Packs/day: 1.00  ?   Years: 5.00  ?  Pack years: 5.00  ?  Types: Cigarettes  ?  Quit date: 03/06/2003  ?  Years since quitting: 18.3  ? Smokeless tobacco: Never  ?Substance and Sexual Activity  ? Alcohol use: Yes  ?  Alcohol/week: 7.0 standard drinks  ?  Types: 7 Glasses of wine per week  ? Drug use: No  ? Sexual activity: Not on file  ?Other Topics Concern  ? Not on file  ?Social History Narrative  ? 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  As of 09/2019  ? ?Social Determinants of Health  ? ?Financial Resource Strain: Not on file  ?Food Insecurity: Not on file  ?Transportation Needs: Not on file  ?Physical Activity: Not on file  ?Stress: Not on file  ?Social Connections: Not on file  ? ? ?His Allergies Are:  ?Allergies  ?Allergen Reactions  ? Morphine And Related Shortness Of Breath  ?:  ? ?His Current Medications Are:  ?Outpatient Encounter Medications as of 07/12/2021  ?Medication Sig  ? amLODipine (NORVASC) 10 MG tablet Take 1 tablet (10 mg total) by mouth daily.  ? aspirin EC 81 MG tablet Take 1 tablet (81 mg total) by mouth daily.  ? cholecalciferol (VITAMIN D3) 25 MCG (1000 UNIT) tablet Take 1,000 Units by mouth daily.  ? lisinopril-hydrochlorothiazide (ZESTORETIC) 20-12.5 MG tablet Take 1 tablet by mouth daily.  ? rosuvastatin (CRESTOR) 20 MG tablet Take 1 tablet (20 mg total) by mouth daily.  ? vitamin E 1000 UNIT capsule Take 1,000 Units by mouth daily.  ? ?No facility-administered encounter medications on file as of 07/12/2021.  ?: ? ? ?Review of Systems:  ?Out of a complete 14 point review of systems, all are reviewed and negative with the exception of these symptoms as listed below: ? ?Review of Systems  ?Neurological:   ?     Pt is here for sleep consult pt states snore,hypertension. Headaches  Pt denies ,fatigue,sleep study.CPAP machine  ? ? ?ESS:7  ?FSS:18  ? ?Objective:  ?Neurological Exam ? ?Physical Exam ?Physical Examination:  ? ?Vitals:  ? 07/12/21 1136  ?BP: (!)  172/87  ?Pulse: 63  ? ?General Examination: The patient is a very pleasant 71 y.o. male in no acute distress. He appears well-developed and well-nourished and well groomed.  ? ?HEENT: Normocephalic, atraumatic, pupils are equal, round and reactive to light, extraocular tracking is good without  limitation to gaze excursion or nystagmus noted. Hearing is grossly intact. Face is symmetric with normal facial animation. Speech is clear with no dysarthria noted. There is no hypophonia. There is no lip, neck/head, jaw or voice tremor. Neck is supple with full range of passive and active motion. There are no carotid bruits on auscultation. Oropharynx exam reveals: mild mouth dryness, edentulous state, moderate airway crowding secondary to tonsillar size of about 1+ and wider-based uvula.  Neck circumference of 17-5/8 inches.  Tongue protrudes centrally and palate elevates symmetrically.  Mallampati class II.  ? ?Chest: Clear to auscultation without wheezing, rhonchi or crackles noted. ? ?Heart: S1+S2+0, regular and normal without murmurs, rubs or gallops noted.  ? ?Abdomen: Soft, non-tender and non-distended with normal bowel sounds appreciated on auscultation. ? ?Extremities: There is no obvious edema in the distal lower extremities bilaterally.  ? ?Skin: Warm and dry without trophic changes noted.  ? ?Musculoskeletal: exam reveals no obvious joint deformities, tenderness or joint swelling or erythema.  ? ?Neurologically:  ?Mental status: The patient is awake, alert and oriented in all 4 spheres. His immediate and remote memory, attention, language skills and fund of knowledge are appropriate. There is no evidence of aphasia, agnosia, apraxia or anomia. Speech is clear with normal prosody and enunciation. Thought process is linear. Mood is normal and affect is normal.  ?Cranial nerves II - XII are as described above under HEENT exam.  ?Motor exam: Normal bulk, strength and tone is noted. There is no obvious tremor. Fine  motor skills and coordination: grossly intact.  ?Cerebellar testing: No dysmetria or intention tremor. There is no truncal or gait ataxia.  ?Sensory exam: intact to light touch in the upper and lower extre

## 2021-07-14 ENCOUNTER — Other Ambulatory Visit: Payer: Self-pay | Admitting: Medical

## 2021-07-14 MED ORDER — METFORMIN HCL 500 MG PO TABS: 500.0000 mg | ORAL_TABLET | Freq: Every day | ORAL | 1 refills | Status: DC

## 2021-07-20 ENCOUNTER — Telehealth: Payer: Self-pay | Admitting: Neurology

## 2021-07-20 NOTE — Telephone Encounter (Signed)
LVM for pt to call back to schedule    [employer] medicare no auth req  

## 2021-07-23 ENCOUNTER — Ambulatory Visit
Admission: RE | Admit: 2021-07-23 | Discharge: 2021-07-23 | Disposition: A | Discharge status: Home / Self Care | Payer: Medicare Other | Source: Ambulatory Visit | Attending: Medical | Admitting: Medical

## 2021-07-23 DIAGNOSIS — R519 Headache, unspecified: Secondary | ICD-10-CM

## 2021-07-23 DIAGNOSIS — I10 Essential (primary) hypertension: Secondary | ICD-10-CM

## 2021-08-02 NOTE — Telephone Encounter (Signed)
Spoke to the patient he stated that he does not have a problem with sleep. He declined the appointment at this time.

## 2021-08-09 NOTE — Progress Notes (Signed)
Left message for pt to call [facility] and schedule with Dr. Annabell Howells

## 2021-08-15 ENCOUNTER — Other Ambulatory Visit: Payer: Self-pay | Admitting: Nurse Practitioner

## 2021-08-15 ENCOUNTER — Other Ambulatory Visit: Payer: Self-pay | Admitting: *Deleted

## 2021-08-15 DIAGNOSIS — R972 Elevated prostate specific antigen [PSA]: Secondary | ICD-10-CM

## 2021-08-29 ENCOUNTER — Ambulatory Visit
Admission: RE | Admit: 2021-08-29 | Discharge: 2021-08-29 | Disposition: A | Discharge status: Home / Self Care | Payer: Medicare Other | Source: Ambulatory Visit | Attending: Nurse Practitioner | Admitting: Nurse Practitioner

## 2021-08-29 DIAGNOSIS — R972 Elevated prostate specific antigen [PSA]: Secondary | ICD-10-CM

## 2021-08-29 MED ORDER — GADOBENATE DIMEGLUMINE 529 MG/ML IV SOLN
20.0000 mL | Freq: Once | INTRAVENOUS | Status: AC | PRN
  Administered 2021-08-29: 20 mL via INTRAVENOUS

## 2021-09-25 ENCOUNTER — Telehealth: Payer: Self-pay | Admitting: Medical

## 2021-09-25 NOTE — Telephone Encounter (Signed)
Left message for patient to call back and schedule Medicare Annual Wellness Visit (AWV) either virtually or in office. I left my number for patient to call [phone].  Last AWV 09/24/19 ; please schedule at anytime with health coach

## 2021-11-17 ENCOUNTER — Ambulatory Visit (INDEPENDENT_AMBULATORY_CARE_PROVIDER_SITE_OTHER): Payer: Medicare Other

## 2021-11-17 VITALS — Ht 67.0 in | Wt 216.0 lb

## 2021-11-17 DIAGNOSIS — Z0000 Encounter for general adult medical examination without abnormal findings: Secondary | ICD-10-CM

## 2021-11-17 NOTE — Progress Notes (Signed)
I connected with Jasmine Decemberavid Longstreth today by telephone and verified that I am speaking with the correct person using two identifiers. Location patient: home Location provider: work Persons participating in the virtual visit: Madilyn HookDavid Granlund, Reesha Debes LPN.   I discussed the limitations, risks, security and privacy concerns of performing an evaluation and management service by telephone and the availability of in person appointments. I also discussed with the patient that there may be a patient responsible charge related to this service. The patient expressed understanding and verbally consented to this telephonic visit.    Interactive audio and video telecommunications were attempted between this provider and patient, however failed, due to patient having technical difficulties OR patient did not have access to video capability.  We continued and completed visit with audio only.     Vital signs may be patient reported or missing.  Subjective:   Nile RiggsDavid L Delaguila is a 71 y.o. male who presents for Medicare Annual/Subsequent preventive examination.  Review of Systems     Cardiac Risk Factors include: advanced age (>6355men, 29>65 women);dyslipidemia;hypertension;male gender;obesity (BMI >30kg/m2)     Objective:    Today's Vitals   11/17/21 0842  Weight: 216 lb (98 kg)  Height: 5\' 7"  (1.702 m)   Body mass index is 33.83 kg/m.     11/17/2021    8:45 AM 12/15/2013    1:44 PM 12/11/2013    3:29 PM  Advanced Directives  Does Patient Have a Medical Advance Directive? No No No  Would patient like information on creating a medical advance directive?  No - patient declined information     Current Medications (verified) Outpatient Encounter Medications as of 11/17/2021  Medication Sig   amLODipine (NORVASC) 10 MG tablet Take 1 tablet (10 mg total) by mouth daily.   aspirin EC 81 MG tablet Take 1 tablet (81 mg total) by mouth daily.   cholecalciferol (VITAMIN D3) 25 MCG (1000 UNIT) tablet Take  1,000 Units by mouth daily.   lisinopril-hydrochlorothiazide (ZESTORETIC) 20-12.5 MG tablet Take 1 tablet by mouth daily.   metFORMIN (GLUCOPHAGE) 500 MG tablet Take 1 tablet (500 mg total) by mouth daily with breakfast.   rosuvastatin (CRESTOR) 20 MG tablet Take 1 tablet (20 mg total) by mouth daily.   vitamin E 1000 UNIT capsule Take 1,000 Units by mouth daily.   No facility-administered encounter medications on file as of 11/17/2021.    Allergies (verified) Morphine and related   History: Past Medical History:  Diagnosis Date   Abnormal abdominal ultrasound 03/2018   no AAA, atherosclerosis present   Arthritis    right knee   BPH (benign prostatic hypertrophy)    with urinary urgency and weak stream, followed by Dr. Bjorn PippinJohn Wrenn, [facility]   Bulging lumbar disc    self reported   DDD (degenerative disc disease), lumbar    Elevated PSA    Dr. Annabell HowellsWrenn, [facility]   Full dentures    H/O echocardiogram 03/2018   [facility], mod concetric LVH, EF 58%. mild left atrial enlargement   Hyperlipidemia    Hypertension    Impaired fasting blood sugar    Obesity    Obesity    Past Surgical History:  Procedure Laterality Date   COLONOSCOPY  2015   mild diverticulosis, repeat 2025; Dr. Deberah CastleGessner   INGUINAL HERNIA REPAIR Right 1995   PROSTATE BIOPSY  2014   x 2; Dr. Annabell HowellsWrenn   Family History  Problem Relation Age of Onset   Heart disease Mother  Diabetes Mother    Hypertension Mother    Heart disease Father    Hypertension Sister    Diabetes Sister    Hypertension Sister    Diabetes Sister    Hypertension Sister    Diabetes Sister    Hypertension Sister    Hypertension Sister    Aneurysm Sister        brain   Hypertension Brother    Cancer Brother 12       prostate   Heart disease Brother 33       CABG, died of CHF   Hypertension Brother    Diabetes Brother    Colon cancer Neg Hx    Stroke Neg Hx    Sleep apnea Neg Hx    Social History    Socioeconomic History   Marital status: Married    Spouse name: Not on file   Number of children: 3   Years of education: Not on file   Highest education level: Not on file  Occupational History   Not on file  Tobacco Use   Smoking status: Former    Packs/day: 1.00    Years: 5.00    Total pack years: 5.00    Types: Cigarettes    Quit date: 03/06/2003    Years since quitting: 18.7   Smokeless tobacco: Never  Vaping Use   Vaping Use: Never used  Substance and Sexual Activity   Alcohol use: Yes    Alcohol/week: 7.0 standard drinks of alcohol    Types: 7 Glasses of wine per week   Drug use: No   Sexual activity: Not on file  Other Topics Concern   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  As of 09/2019   Social Determinants of Health   Financial Resource Strain: Low Risk  (11/17/2021)   Overall Financial Resource Strain (CARDIA)    Difficulty of Paying Living Expenses: Not hard at all  Food Insecurity: No Food Insecurity (11/17/2021)   Hunger Vital Sign    Worried About Running Out of Food in the Last Year: Never true    Ran Out of Food in the Last Year: Never true  Transportation Needs: No Transportation Needs (11/17/2021)   PRAPARE - Administrator, Civil Service (Medical): No    Lack of Transportation (Non-Medical): No  Physical Activity: Inactive (11/17/2021)   Exercise Vital Sign    Days of Exercise per Week: 0 days    Minutes of Exercise per Session: 0 min  Stress: No Stress Concern Present (11/17/2021)   [employer] of Occupational Health - Occupational Stress Questionnaire    Feeling of Stress : Not at all  Social Connections: Not on file    Tobacco Counseling Counseling given: Not Answered   Clinical Intake:  Pre-visit preparation completed: Yes  Pain : No/denies pain     Nutritional Risks: None Diabetes: No  How often do you need to  have someone help you when you read instructions, pamphlets, or other written materials from your doctor or pharmacy?: 1 - Never What is the last grade level you completed in school?: 10th grade  Diabetic?no  Interpreter Needed?: No  Information entered by :: NAllen LPN   Activities of Daily Living    11/17/2021    8:47 AM  In your present state of health, do you have any difficulty performing the following activities:  Hearing? 0  Vision? 0  Difficulty concentrating or making decisions? 0  Walking or climbing stairs? 0  Dressing or bathing? 0  Doing errands, shopping? 0  Preparing Food and eating ? N  Using the Toilet? N  In the past six months, have you accidently leaked urine? N  Do you have problems with loss of bowel control? N  Managing your Medications? N  Managing your Finances? N  Housekeeping or managing your Housekeeping? N    Patient Care Team: Tysinger, Kermit Balo, PA-C as PCP - General (Family Medicine)  Indicate any recent Medical Services you may have received from other than [facility] providers in the past year (date may be approximate).     Assessment:   This is a routine wellness examination for Ajai.  Hearing/Vision screen Vision Screening - Comments:: No regular eye exams,   Dietary issues and exercise activities discussed: Current Exercise Habits: The patient does not participate in regular exercise at present   Goals Addressed             This Visit's Progress    Patient Stated       11/17/2021, no goals       Depression Screen    11/17/2021    8:46 AM 07/11/2021   10:21 AM 12/13/2020    9:04 AM 11/03/2020   10:30 AM 09/24/2019   12:00 PM 08/05/2018    9:28 AM 12/11/2016    9:26 AM  PHQ 2/9 Scores  PHQ - 2 Score 0 0 0 0 0 0 0  PHQ- 9 Score 0          Fall Risk    11/17/2021    8:46 AM 07/11/2021   10:21 AM 12/13/2020    9:04 AM 11/03/2020   10:30 AM 09/24/2019   12:00 PM  Fall Risk   Falls in the past year? 0 0 0 0 0  Number falls in  past yr: 0 0 0 0   Injury with Fall? 0 0 0 0   Risk for fall due to : Medication side effect No Fall Risks No Fall Risks No Fall Risks   Follow up Falls prevention discussed;Education provided;Falls evaluation completed Falls evaluation completed Falls evaluation completed Falls evaluation completed     FALL RISK PREVENTION PERTAINING TO THE HOME:  Any stairs in or around the home? No  If so, are there any without handrails?  N/a Home free of loose throw rugs in walkways, pet beds, electrical cords, etc? Yes  Adequate lighting in your home to reduce risk of falls? Yes   ASSISTIVE DEVICES UTILIZED TO PREVENT FALLS:  Life alert? No  Use of a cane, walker or w/c? No  Grab bars in the bathroom? No  Shower chair or bench in shower? No  Elevated toilet seat or a handicapped toilet? Yes   TIMED UP AND GO:  Was the test performed? No .      Cognitive Function:        11/17/2021    8:47 AM  6CIT Screen  What Year? 0 points  What month? 0 points  What time? 0 points  Count back from 20 0 points  Months in reverse 4 points  Repeat phrase 8 points  Total Score 12 points    Immunizations Immunization History  Administered Date(s) Administered   Fluad Quad(high Dose 65+) 11/18/2019, 11/03/2020   Moderna Covid-19 Vaccine Bivalent Booster 96yrs & up 12/13/2020   Moderna Sars-Covid-2 Vaccination 04/19/2019, 05/17/2019, 02/01/2020   Pneumococcal  Conjugate-13 09/24/2019   Pneumococcal Polysaccharide-23 12/15/2015   Tdap 09/15/2014    TDAP status: Up to date  Flu Vaccine status: Due, Education has been provided regarding the importance of this vaccine. Advised may receive this vaccine at local pharmacy or Health Dept. Aware to provide a copy of the vaccination record if obtained from local pharmacy or Health Dept. Verbalized acceptance and understanding.  Pneumococcal vaccine status: Up to date  Covid-19 vaccine status: Completed vaccines  Qualifies for Shingles Vaccine? Yes    Zostavax completed No   Shingrix Completed?: No.    Education has been provided regarding the importance of this vaccine. Patient has been advised to call insurance company to determine out of pocket expense if they have not yet received this vaccine. Advised may also receive vaccine at local pharmacy or Health Dept. Verbalized acceptance and understanding.  Screening Tests Health Maintenance  Topic Date Due   Zoster Vaccines- Shingrix (1 of 2) Never done   Pneumonia Vaccine 54+ Years old (3 - PPSV23 or PCV20) 12/14/2020   COVID-19 Vaccine (5 - Moderna series) 04/15/2021   INFLUENZA VACCINE  10/03/2021   COLONOSCOPY (Pts 45-78yrs Insurance coverage will need to be confirmed)  12/16/2023   TETANUS/TDAP  09/14/2024   Hepatitis C Screening  Completed   HPV VACCINES  Aged Out    Health Maintenance  Health Maintenance Due  Topic Date Due   Zoster Vaccines- Shingrix (1 of 2) Never done   Pneumonia Vaccine 61+ Years old (3 - PPSV23 or PCV20) 12/14/2020   COVID-19 Vaccine (5 - Moderna series) 04/15/2021   INFLUENZA VACCINE  10/03/2021    Colorectal cancer screening: Type of screening: Colonoscopy. Completed 12/15/2013. Repeat every 10 years  Lung Cancer Screening: (Low Dose CT Chest recommended if Age 26-80 years, 30 pack-year currently smoking OR have quit w/in 15years.) does not qualify.   Lung Cancer Screening Referral: no  Additional Screening:  Hepatitis C Screening: does qualify; Completed 12/07/2020  Vision Screening: Recommended annual ophthalmology exams for early detection of glaucoma and other disorders of the eye. Is the patient up to date with their annual eye exam?  No  Who is the provider or what is the name of the office in which the patient attends annual eye exams? none If pt is not established with a provider, would they like to be referred to a provider to establish care? No .   Dental Screening: Recommended annual dental exams for proper oral  hygiene  Community Resource Referral / Chronic Care Management: CRR required this visit?  No   CCM required this visit?  No      Plan:     I have personally reviewed and noted the following in the patient's chart:   Medical and social history Use of alcohol, tobacco or illicit drugs  Current medications and supplements including opioid prescriptions. Patient is not currently taking opioid prescriptions. Functional ability and status Nutritional status Physical activity Advanced directives List of other physicians Hospitalizations, surgeries, and ER visits in previous 12 months Vitals Screenings to include cognitive, depression, and falls Referrals and appointments  In addition, I have reviewed and discussed with patient certain preventive protocols, quality metrics, and best practice recommendations. A written personalized care plan for preventive services as well as general preventive health recommendations were provided to patient.     Barb Merino, LPN   8/50/2774   Nurse Notes: none  Due to this being a virtual visit, the after visit summary with patients personalized plan  was offered to patient via mail or my-chart.  Patient would like to access on my-chart

## 2021-11-17 NOTE — Patient Instructions (Signed)
Luke Orr , Thank you for taking time to come for your Medicare Wellness Visit. I appreciate your ongoing commitment to your health goals. Please review the following plan we discussed and let me know if I can assist you in the future.   Screening recommendations/referrals: Colonoscopy: completed 12/15/2013, due 12/16/2023 Recommended yearly ophthalmology/optometry visit for glaucoma screening and checkup Recommended yearly dental visit for hygiene and checkup  Vaccinations: Influenza vaccine: due Pneumococcal vaccine: completed 09/24/2019 Tdap vaccine: completed 09/15/2014, due 09/14/2024 Shingles vaccine: discussed   Covid-19:  12/13/2020, 02/01/2020, 05/17/2019, 04/19/2019  Advanced directives: Advance directive discussed with you today. .  Conditions/risks identified: none  Next appointment: Follow up in one year for your annual wellness visit.   Preventive Care 69 Years and Older, Male Preventive care refers to lifestyle choices and visits with your health care provider that can promote health and wellness. What does preventive care include? A yearly physical exam. This is also called an annual well check. Dental exams once or twice a year. Routine eye exams. Ask your health care provider how often you should have your eyes checked. Personal lifestyle choices, including: Daily care of your teeth and gums. Regular physical activity. Eating a healthy diet. Avoiding tobacco and drug use. Limiting alcohol use. Practicing safe sex. Taking low doses of aspirin every day. Taking vitamin and mineral supplements as recommended by your health care provider. What happens during an annual well check? The services and screenings done by your health care provider during your annual well check will depend on your age, overall health, lifestyle risk factors, and family history of disease. Counseling  Your health care provider may ask you questions about your: Alcohol use. Tobacco  use. Drug use. Emotional well-being. Home and relationship well-being. Sexual activity. Eating habits. History of falls. Memory and ability to understand (cognition). Work and work Astronomer. Screening  You may have the following tests or measurements: Height, weight, and BMI. Blood pressure. Lipid and cholesterol levels. These may be checked every 5 years, or more frequently if you are over 69 years old. Skin check. Lung cancer screening. You may have this screening every year starting at age 69 if you have a 30-pack-year history of smoking and currently smoke or have quit within the past 15 years. Fecal occult blood test (FOBT) of the stool. You may have this test every year starting at age 69. Flexible sigmoidoscopy or colonoscopy. You may have a sigmoidoscopy every 5 years or a colonoscopy every 10 years starting at age 69. Prostate cancer screening. Recommendations will vary depending on your family history and other risks. Hepatitis C blood test. Hepatitis B blood test. Sexually transmitted disease (STD) testing. Diabetes screening. This is done by checking your blood sugar (glucose) after you have not eaten for a while (fasting). You may have this done every 1-3 years. Abdominal aortic aneurysm (AAA) screening. You may need this if you are a current or former smoker. Osteoporosis. You may be screened starting at age 69 if you are at high risk. Talk with your health care provider about your test results, treatment options, and if necessary, the need for more tests. Vaccines  Your health care provider may recommend certain vaccines, such as: Influenza vaccine. This is recommended every year. Tetanus, diphtheria, and acellular pertussis (Tdap, Td) vaccine. You may need a Td booster every 10 years. Zoster vaccine. You may need this after age 69. Pneumococcal 13-valent conjugate (PCV13) vaccine. One dose is recommended after age 69. Pneumococcal polysaccharide (PPSV23) vaccine.  One  dose is recommended after age 69. Talk to your health care provider about which screenings and vaccines you need and how often you need them. This information is not intended to replace advice given to you by your health care provider. Make sure you discuss any questions you have with your health care provider. Document Released: 03/18/2015 Document Revised: 11/09/2015 Document Reviewed: 12/21/2014 Elsevier Interactive Patient Education  2017 [place] Prevention in the Home Falls can cause injuries. They can happen to people of all ages. There are many things you can do to make your home safe and to help prevent falls. What can I do on the outside of my home? Regularly fix the edges of walkways and driveways and fix any cracks. Remove anything that might make you trip as you walk through a door, such as a raised step or threshold. Trim any bushes or trees on the path to your home. Use bright outdoor lighting. Clear any walking paths of anything that might make someone trip, such as rocks or tools. Regularly check to see if handrails are loose or broken. Make sure that both sides of any steps have handrails. Any raised decks and porches should have guardrails on the edges. Have any leaves, snow, or ice cleared regularly. Use sand or salt on walking paths during winter. Clean up any spills in your garage right away. This includes oil or grease spills. What can I do in the bathroom? Use night lights. Install grab bars by the toilet and in the tub and shower. Do not use towel bars as grab bars. Use non-skid mats or decals in the tub or shower. If you need to sit down in the shower, use a plastic, non-slip stool. Keep the floor dry. Clean up any water that spills on the floor as soon as it happens. Remove soap buildup in the tub or shower regularly. Attach bath mats securely with double-sided non-slip rug tape. Do not have throw rugs and other things on the floor that can make  you trip. What can I do in the bedroom? Use night lights. Make sure that you have a light by your bed that is easy to reach. Do not use any sheets or blankets that are too big for your bed. They should not hang down onto the floor. Have a firm chair that has side arms. You can use this for support while you get dressed. Do not have throw rugs and other things on the floor that can make you trip. What can I do in the kitchen? Clean up any spills right away. Avoid walking on wet floors. Keep items that you use a lot in easy-to-reach places. If you need to reach something above you, use a strong step stool that has a grab bar. Keep electrical cords out of the way. Do not use floor polish or wax that makes floors slippery. If you must use wax, use non-skid floor wax. Do not have throw rugs and other things on the floor that can make you trip. What can I do with my stairs? Do not leave any items on the stairs. Make sure that there are handrails on both sides of the stairs and use them. Fix handrails that are broken or loose. Make sure that handrails are as long as the stairways. Check any carpeting to make sure that it is firmly attached to the stairs. Fix any carpet that is loose or worn. Avoid having throw rugs at the top or bottom of the stairs.  If you do have throw rugs, attach them to the floor with carpet tape. Make sure that you have a light switch at the top of the stairs and the bottom of the stairs. If you do not have them, ask someone to add them for you. What else can I do to help prevent falls? Wear shoes that: Do not have high heels. Have rubber bottoms. Are comfortable and fit you well. Are closed at the toe. Do not wear sandals. If you use a stepladder: Make sure that it is fully opened. Do not climb a closed stepladder. Make sure that both sides of the stepladder are locked into place. Ask someone to hold it for you, if possible. Clearly mark and make sure that you can  see: Any grab bars or handrails. First and last steps. Where the edge of each step is. Use tools that help you move around (mobility aids) if they are needed. These include: Canes. Walkers. Scooters. Crutches. Turn on the lights when you go into a dark area. Replace any light bulbs as soon as they burn out. Set up your furniture so you have a clear path. Avoid moving your furniture around. If any of your floors are uneven, fix them. If there are any pets around you, be aware of where they are. Review your medicines with your doctor. Some medicines can make you feel dizzy. This can increase your chance of falling. Ask your doctor what other things that you can do to help prevent falls. This information is not intended to replace advice given to you by your health care provider. Make sure you discuss any questions you have with your health care provider. Document Released: 12/16/2008 Document Revised: 07/28/2015 Document Reviewed: 03/26/2014 Elsevier Interactive Patient Education  2017 [employer].

## 2021-11-21 ENCOUNTER — Ambulatory Visit (INDEPENDENT_AMBULATORY_CARE_PROVIDER_SITE_OTHER): Payer: Medicare Other | Admitting: Medical

## 2021-11-21 VITALS — BP 130/82 | HR 62 | Temp 97.2°F | Wt 214.2 lb

## 2021-11-21 DIAGNOSIS — J309 Allergic rhinitis, unspecified: Secondary | ICD-10-CM

## 2021-11-21 DIAGNOSIS — J339 Nasal polyp, unspecified: Secondary | ICD-10-CM

## 2021-11-21 DIAGNOSIS — R143 Flatulence: Secondary | ICD-10-CM | POA: Diagnosis not present

## 2021-11-21 DIAGNOSIS — R053 Chronic cough: Secondary | ICD-10-CM | POA: Diagnosis not present

## 2021-11-21 DIAGNOSIS — Z87891 Personal history of nicotine dependence: Secondary | ICD-10-CM

## 2021-11-21 MED ORDER — CETIRIZINE HCL 10 MG PO TABS: 10.0000 mg | ORAL_TABLET | Freq: Every day | ORAL | 11 refills | Status: AC

## 2021-11-21 MED ORDER — BENZONATATE 200 MG PO CAPS: 200.0000 mg | ORAL_CAPSULE | Freq: Three times a day (TID) | ORAL | 0 refills | Status: DC | PRN

## 2021-11-21 NOTE — Progress Notes (Signed)
Subjective:  Luke Orr is a 71 y.o. male who presents for Chief Complaint  Patient presents with   cough and scratchy throat    Cough and scratchy throat. Negative for covid. Been going on for a couple months     He is here for intermittent cough and scratchy throat for months. It will ease up for a few weeks then persist.    No recent fevers.  Sweats a lot in general.   No loss of appetite, no change in weight.   Does get some runny nose.  Has occasional sneezing.   No itchy or water eyes. Coughs up some mucous occasionally.   Nonsmoker, former smoker.  Smoked about 15 years, quit 10 years ago.   No wheezing, no SOB.  No coughing up blood.     No belching, no heartburn, but some gas.    No other aggravating or relieving factors.    No other c/o.  Past Medical History:  Diagnosis Date   Abnormal abdominal ultrasound 03/2018   no AAA, atherosclerosis present   Arthritis    right knee   BPH (benign prostatic hypertrophy)    with urinary urgency and weak stream, followed by Dr. Bjorn Pippin, [facility]   Bulging lumbar disc    self reported   DDD (degenerative disc disease), lumbar    Elevated PSA    Dr. Annabell Howells, [facility]   Full dentures    H/O echocardiogram 03/2018   [facility], mod concetric LVH, EF 58%. mild left atrial enlargement   Hyperlipidemia    Hypertension    Impaired fasting blood sugar    Obesity    Obesity    Current Outpatient Medications on File Prior to Visit  Medication Sig Dispense Refill   amLODipine (NORVASC) 10 MG tablet Take 1 tablet (10 mg total) by mouth daily. 90 tablet 3   aspirin EC 81 MG tablet Take 1 tablet (81 mg total) by mouth daily. 90 tablet 3   cholecalciferol (VITAMIN D3) 25 MCG (1000 UNIT) tablet Take 1,000 Units by mouth daily.     lisinopril-hydrochlorothiazide (ZESTORETIC) 20-12.5 MG tablet Take 1 tablet by mouth daily. 90 tablet 3   metFORMIN (GLUCOPHAGE) 500 MG tablet Take 1 tablet (500 mg total) by  mouth daily with breakfast. 90 tablet 1   rosuvastatin (CRESTOR) 20 MG tablet Take 1 tablet (20 mg total) by mouth daily. 90 tablet 3   vitamin E 1000 UNIT capsule Take 1,000 Units by mouth daily.     No current facility-administered medications on file prior to visit.     The following portions of the patient's history were reviewed and updated as appropriate: allergies, current medications, past family history, past medical history, past social history, past surgical history and problem list.  ROS Otherwise as in subjective above  Objective: BP 130/82   Pulse 62   Temp (!) 97.2 F (36.2 C)   Wt 214 lb 3.2 oz (97.2 kg)   BMI 33.55 kg/m   Wt Readings from Last 3 Encounters:  11/21/21 214 lb 3.2 oz (97.2 kg)  11/17/21 216 lb (98 kg)  07/12/21 219 lb (99.3 kg)   General appearance: alert, no distress, well developed, well nourished HEENT: normocephalic, sclerae anicteric, conjunctiva pink and moist, TMs pearly, nares with turbinated edema, nasal polyp left nares, clear discharge, no erythema, pharynx normal Oral cavity: MMM, no lesions Neck: supple, no lymphadenopathy, no thyromegaly, no masses Heart: RRR, normal S1, S2, no murmurs Lungs: CTA bilaterally, no  wheezes, rhonchi, or rales Abdomen: +bs, soft, non tender, non distended, no masses, no hepatomegaly, no splenomegaly Pulses: 2+ radial pulses, 2+ pedal pulses, normal cap refill Ext: no edema   Assessment: Encounter Diagnoses  Name Primary?   Chronic cough Yes   Allergic rhinitis, unspecified seasonality, unspecified trigger    Flatulence    Nasal polyp    Former smoker      Plan: Your chronic cough symptoms suggest allergies.  I recommend you begin cetirizine allergy pill daily at bedtime.  I also prescribed a cough drops you can use periodically as needed.  I sent both of these medications to the pharmacy.  Go for chest x-ray just to make sure no other cause of chronic cough.  See information below.  Given  the gas you can consider stopping metformin or vitamin D or both for 2 weeks to see if the gas goes away.  I suspect this is more related to what you are eating.  Foods that can commonly cause gas includes broccoli, Brussels sprouts, fried foods, onions and other.   Ziv was seen today for cough and scratchy throat.  Diagnoses and all orders for this visit:  Chronic cough -     DG Chest 2 View; Future  Allergic rhinitis, unspecified seasonality, unspecified trigger  Flatulence  Nasal polyp  Former smoker  Other orders -     benzonatate (TESSALON) 200 MG capsule; Take 1 capsule (200 mg total) by mouth 3 (three) times daily as needed for cough. -     cetirizine (ZYRTEC) 10 MG tablet; Take 1 tablet (10 mg total) by mouth at bedtime.    Follow up: pending chest xray

## 2021-11-21 NOTE — Patient Instructions (Signed)
Your chronic cough symptoms suggest allergies.  I recommend you begin cetirizine allergy pill daily at bedtime.  I also prescribed a cough drops you can use periodically as needed.  I sent both of these medications to the pharmacy.  Go for chest x-ray just to make sure no other cause of chronic cough.  See information below.  Given the gas you can consider stopping metformin or vitamin D or both for 2 weeks to see if the gas goes away.  I suspect this is more related to what you are eating.  Foods that can commonly cause gas includes broccoli, Brussels sprouts, fried foods, onions and other.   Please go to [facility] for your chest xray.   Their hours are 8am - 4:30 pm Monday - Friday.  Take your insurance card with you.  [facility] [phone]  [place] [employer], [place],  [address]  [address] [place], [place] [address]     Cough, Adult A cough helps to clear your throat and lungs. A cough may be a sign of an illness or another medical condition. An acute cough may only last 2-3 weeks, while a chronic cough may last 8 or more weeks. Many things can cause a cough. They include: Germs (viruses or bacteria) that attack the airway. Breathing in things that bother (irritate) your lungs. Allergies. Asthma. Mucus that runs down the back of your throat (postnasal drip). Smoking. Acid backing up from the stomach into the tube that moves food from the mouth to the stomach (gastroesophageal reflux). Some medicines. Lung problems. Other medical conditions, such as heart failure or a blood clot in the lung (pulmonary embolism). Follow these instructions at home: Medicines Take over-the-counter and prescription medicines only as told by your doctor. Talk with your doctor before you take medicines that stop a cough (cough suppressants). Lifestyle  Do not smoke, and try not to be around smoke. Do not use any products that contain nicotine or tobacco, such  as cigarettes, e-cigarettes, and chewing tobacco. If you need help quitting, ask your doctor. Drink enough fluid to keep your pee (urine) pale yellow. Avoid caffeine. Do not drink alcohol if your doctor tells you not to drink. General instructions  Watch for any changes in your cough. Tell your doctor about them. Always cover your mouth when you cough. Stay away from things that make you cough, such as perfume, candles, campfire smoke, or cleaning products. If the air is dry, use a cool mist vaporizer or humidifier in your home. If your cough is worse at night, try using extra pillows to raise your head up higher while you sleep. Rest as needed. Keep all follow-up visits as told by your doctor. This is important. Contact a doctor if: You have new symptoms. You cough up pus. Your cough does not get better after 2-3 weeks, or your cough gets worse. Cough medicine does not help your cough and you are not sleeping well. You have pain that gets worse or pain that is not helped with medicine. You have a fever. You are losing weight and you do not know why. You have night sweats. Get help right away if: You cough up blood. You have trouble breathing. Your heartbeat is very fast. These symptoms may be an emergency. Do not wait to see if the symptoms will go away. Get medical help right away. Call your local emergency services (911 in the [place] Do not drive yourself to the hospital. Summary A cough helps  to clear your throat and lungs. Many things can cause a cough. Take over-the-counter and prescription medicines only as told by your doctor. Always cover your mouth when you cough. Contact a doctor if you have new symptoms or you have a cough that does not get better or gets worse. This information is not intended to replace advice given to you by your health care provider. Make sure you discuss any questions you have with your health care provider. Document Revised: 04/10/2019 Document  Reviewed: 03/10/2018 Elsevier Patient Education  2023 [employer].

## 2021-12-25 ENCOUNTER — Ambulatory Visit: Payer: Medicare Other

## 2021-12-25 DIAGNOSIS — I77810 Thoracic aortic ectasia: Secondary | ICD-10-CM

## 2022-01-04 ENCOUNTER — Other Ambulatory Visit: Payer: Self-pay | Admitting: Medical

## 2022-01-05 ENCOUNTER — Other Ambulatory Visit: Payer: Self-pay | Admitting: Medical

## 2022-01-06 ENCOUNTER — Other Ambulatory Visit: Payer: Self-pay | Admitting: Medical

## 2022-01-06 DIAGNOSIS — I10 Essential (primary) hypertension: Secondary | ICD-10-CM

## 2022-01-23 ENCOUNTER — Encounter: Payer: Self-pay | Admitting: Cardiology

## 2022-01-23 ENCOUNTER — Ambulatory Visit: Payer: Medicare Other | Admitting: Cardiology

## 2022-01-23 VITALS — BP 158/84 | HR 87 | Resp 16 | Ht 67.0 in | Wt 224.0 lb

## 2022-01-23 DIAGNOSIS — R002 Palpitations: Secondary | ICD-10-CM

## 2022-01-23 DIAGNOSIS — E782 Mixed hyperlipidemia: Secondary | ICD-10-CM

## 2022-01-23 DIAGNOSIS — Z8249 Family history of ischemic heart disease and other diseases of the circulatory system: Secondary | ICD-10-CM

## 2022-01-23 NOTE — Progress Notes (Deleted)
Follow up visit  Subjective:   Luke Orr, male    DOB: March 27, 1950, 71 y.o.   MRN: [id number]     HPI  No chief complaint on file.   71 y.o. African American male with hypertension, hyperlipidemia, prediabetes, former smoker, family history of coronary artery disease.  Patient was last seen by me in 2020.  Patient has been experiencing "rumble" in his chest for last 2 months.  Episodes occur unrelated to exertion, last off and on for several minutes.  He denies any associated chest pain or shortness of breath symptoms.  Blood pressure is elevated today, but reportedly normal at home and always elevated at doctor's office.  He reports fatigue in both his legs, as well as pain in his right eye for last several days.  Fatigue is worse on exertion, pain is present unrelated to exertion.   Current Outpatient Medications:    amLODipine (NORVASC) 10 MG tablet, Take 1 tablet (10 mg total) by mouth daily., Disp: 90 tablet, Rfl: 3   aspirin EC 81 MG tablet, Take 1 tablet (81 mg total) by mouth daily., Disp: 90 tablet, Rfl: 3   benzonatate (TESSALON) 200 MG capsule, Take 1 capsule (200 mg total) by mouth 3 (three) times daily as needed for cough., Disp: 30 capsule, Rfl: 0   cetirizine (ZYRTEC) 10 MG tablet, Take 1 tablet (10 mg total) by mouth at bedtime., Disp: 30 tablet, Rfl: 11   cholecalciferol (VITAMIN D3) 25 MCG (1000 UNIT) tablet, Take 1,000 Units by mouth daily., Disp: , Rfl:    lisinopril-hydrochlorothiazide (ZESTORETIC) 20-12.5 MG tablet, TAKE 1 TABLET BY MOUTH EVERY DAY, Disp: 90 tablet, Rfl: 1   metFORMIN (GLUCOPHAGE) 500 MG tablet, TAKE 1 TABLET BY MOUTH EVERY DAY WITH BREAKFAST, Disp: 30 tablet, Rfl: 0   rosuvastatin (CRESTOR) 20 MG tablet, TAKE 1 TABLET BY MOUTH EVERY DAY, Disp: 90 tablet, Rfl: 0   vitamin E 1000 UNIT capsule, Take 1,000 Units by mouth daily., Disp: , Rfl:    Cardiovascular & other pertient studies:   EKG 12/13/2020: Sinus rhythm Normal  EKG  Echocardiogram 03/2018: Moderate concentric LVH.  EF 58%.  Normal diastolic filling pattern. Mild left atrial dilatation. Trace mitral and tricuspid regurgitation.  Aorta duplex 03/2018: No AAA. Diameter 2.03 cm. Diffuse mild calcific plaque in proximal, mid, and distal aorta.  Normal flow velocities noted in the aorta and bilateral iliac arteries.   Recent labs: 12/13/2020: Glucose 113, BUN/Cr 11/0.89. EGFR 94. Na/K 141/4.0. Rest of the CMP normal H/H 15/44. MCV 87. Platelets 288 HbA1C 6.3%  09/2019: Chol 171, TG 83, HDL 54, LDL 102    Review of Systems  Cardiovascular:  Positive for claudication and palpitations. Negative for chest pain, dyspnea on exertion, leg swelling and syncope.        There were no vitals filed for this visit.   There is no height or weight on file to calculate BMI. There were no vitals filed for this visit.    Objective:   Physical Exam Vitals and nursing note reviewed.  Constitutional:      General: He is not in acute distress. Neck:     Vascular: No JVD.  Cardiovascular:     Rate and Rhythm: Normal rate and regular rhythm.     Pulses:          Dorsalis pedis pulses are 0 on the right side and 0 on the left side.       Posterior tibial pulses are 1+ on  the right side and 1+ on the left side.     Heart sounds: Murmur heard.     High-pitched blowing holosystolic murmur is present with a grade of 2/6 at the apex.  Pulmonary:     Effort: Pulmonary effort is normal.     Breath sounds: Normal breath sounds. No wheezing or rales.  Musculoskeletal:     Right lower leg: No edema.     Left lower leg: No edema.           Assessment & Recommendations:   71 y.o. African American male with hypertension, hyperlipidemia, prediabetes, former smoker, family history of coronary artery disease.  *** Palpitations: Differentials include tachyarrhythmias like A. fib.  Recommend echocardiogram and 2-week cardiac  telemetry.  *** Hypertension: Suspect component of whitecoat hypertension.  No change made today.  Consider sleep study, defer to PCP.  *** Bilateral leg pain, right thigh pain: Diminished distal pulses on exam.  Suspect claudication, will obtain lower extremity duplex ultrasound.  In addition, he has had right thigh pain unrelated to exertion.  We will also obtain DVT study for right leg.  ***     Nigel Mormon, MD Pager: [phone] Office: [phone]

## 2022-01-23 NOTE — Progress Notes (Signed)
Follow up visit  Subjective:   Luke Orr, male    DOB: 1951/01/07, 71 y.o.   MRN: [id number]     HPI  Chief Complaint  Patient presents with   Palpitations   Follow-up    71 y.o. African American male with hypertension, hyperlipidemia, prediabetes, former smoker, family history of coronary artery disease.  Patient is doing well. denies chest pain, shortness of breath, palpitations, leg edema, orthopnea, PND, TIA/syncope. Blood pressure elevated, better at home.     Current Outpatient Medications:    amLODipine (NORVASC) 10 MG tablet, Take 1 tablet (10 mg total) by mouth daily., Disp: 90 tablet, Rfl: 3   aspirin EC 81 MG tablet, Take 1 tablet (81 mg total) by mouth daily., Disp: 90 tablet, Rfl: 3   cetirizine (ZYRTEC) 10 MG tablet, Take 1 tablet (10 mg total) by mouth at bedtime., Disp: 30 tablet, Rfl: 11   cholecalciferol (VITAMIN D3) 25 MCG (1000 UNIT) tablet, Take 1,000 Units by mouth daily., Disp: , Rfl:    lisinopril-hydrochlorothiazide (ZESTORETIC) 20-12.5 MG tablet, TAKE 1 TABLET BY MOUTH EVERY DAY, Disp: 90 tablet, Rfl: 1   metFORMIN (GLUCOPHAGE) 500 MG tablet, TAKE 1 TABLET BY MOUTH EVERY DAY WITH BREAKFAST, Disp: 30 tablet, Rfl: 0   rosuvastatin (CRESTOR) 20 MG tablet, TAKE 1 TABLET BY MOUTH EVERY DAY, Disp: 90 tablet, Rfl: 0   vitamin E 1000 UNIT capsule, Take 1,000 Units by mouth daily., Disp: , Rfl:    Cardiovascular & other pertient studies:  EKG 01/23/2022: Sinus rhythm 76 bpm  Normal EKG  Mobile cardiac telemetry 14 days 01/13/2021 - 01/27/2021: Dominant rhythm: Sinus. HR 50-156 bpm. Avg HR 75 bpm. 1 episode of probable atrial tachycardia at 124 bpm for 4 beats. <1% isolated SVE, couplet/triplets. 0 episodes of VT. <1% isolated VE, couplets. No atrial fibrillation/atrial flutter/VT/high grade AV block, sinus pause >3sec noted. 3 patient triggered events, correlated with sinus rhythm/artifact.   Lower Extremity Arterial Duplex  01/30/2021:  No  hemodynamically significant stenoses are identified in the right lower  extremity arterial system. No hemodynamically significant stenoses are  identified in the left lower extremity arterial system.  This exam reveals normal perfusion of the right lower extremity (ABI 1.05)  and normal perfusion of the left lower extremity (ABI 1.11) with  multiphasic wave form at the ankle.  Evaluate for pseudo-claudication.   Echocardiogram 12/25/2021: Left ventricle cavity is normal in size and wall thickness. Normal global wall motion. Normal LV systolic function with EF 64%. Doppler evidence of grade I (impaired) diastolic dysfunction, normal LAP. The aortic root is normal. Proximal ascending aorta not well visualized (Previously reported 38 mm on 01/30/2021). Structurally normal trileaflet aortic valve.  Mild (Grade I) aortic regurgitation. Mild (Grade I) mitral regurgitation. Normal right atrial pressure.  Aorta duplex 03/2018: No AAA. Diameter 2.03 cm. Diffuse mild calcific plaque in proximal, mid, and distal aorta.  Normal flow velocities noted in the aorta and bilateral iliac arteries.   Recent labs: Glucose 130, BUN/Cr 12/0.86. EGFR 94. Na/K 139/3.7.  HbA1C 5.9%   12/13/2020: Glucose 113, BUN/Cr 11/0.89. EGFR 94. Na/K 141/4.0. Rest of the CMP normal H/H 15/44. MCV 87. Platelets 288 HbA1C 6.3%  09/2019: Chol 171, TG 83, HDL 54, LDL 102    Review of Systems  Cardiovascular:  Positive for claudication and palpitations. Negative for chest pain, dyspnea on exertion, leg swelling and syncope.        Vitals:   01/23/22 1409  BP: (!) 158/84  Pulse:  87  Resp: 16  SpO2: 97%    Body mass index is 35.08 kg/m. Filed Weights   01/23/22 1409  Weight: 224 lb (101.6 kg)     Objective:   Physical Exam Vitals and nursing note reviewed.  Constitutional:      General: He is not in acute distress. Neck:     Vascular: No JVD.  Cardiovascular:     Rate and Rhythm: Normal rate  and regular rhythm.     Pulses:          Dorsalis pedis pulses are 0 on the right side and 0 on the left side.       Posterior tibial pulses are 1+ on the right side and 1+ on the left side.     Heart sounds: Murmur heard.     High-pitched blowing holosystolic murmur is present with a grade of 2/6 at the apex.  Pulmonary:     Effort: Pulmonary effort is normal.     Breath sounds: Normal breath sounds. No wheezing or rales.  Musculoskeletal:     Right lower leg: No edema.     Left lower leg: No edema.           Assessment & Recommendations:   71 y.o. African American male with hypertension, hyperlipidemia, prediabetes, former smoker, family history of coronary artery disease.  Palpitations: No recurrence  Hypertension: Suspect component of whitecoat hypertension.  No change made today.  Consider sleep study, defer to PCP.  Bilateral leg pain, right thigh pain: Pseudoclaudication  Mixed hyperlipidemia: Family h/o early CAD. Already on Crestor 20 mg. Heck lippid panel and lipoprotein (a).    Nigel Mormon, MD Pager: [phone] Office: [phone]

## 2022-01-24 ENCOUNTER — Ambulatory Visit: Payer: Medicare Other | Admitting: Cardiology

## 2022-01-30 ENCOUNTER — Other Ambulatory Visit: Payer: Self-pay | Admitting: Medical

## 2022-02-08 ENCOUNTER — Other Ambulatory Visit: Payer: Self-pay | Admitting: Medical

## 2022-02-08 DIAGNOSIS — I10 Essential (primary) hypertension: Secondary | ICD-10-CM

## 2022-04-04 ENCOUNTER — Other Ambulatory Visit: Payer: Self-pay | Admitting: Medical

## 2022-05-04 ENCOUNTER — Ambulatory Visit (INDEPENDENT_AMBULATORY_CARE_PROVIDER_SITE_OTHER): Payer: Medicare Other | Admitting: Medical

## 2022-05-04 VITALS — BP 130/82 | HR 62 | Wt 219.6 lb

## 2022-05-04 DIAGNOSIS — H9202 Otalgia, left ear: Secondary | ICD-10-CM | POA: Diagnosis not present

## 2022-05-04 DIAGNOSIS — R09A9 Foreign body sensation, other site: Secondary | ICD-10-CM | POA: Diagnosis not present

## 2022-05-04 NOTE — Progress Notes (Signed)
Subjective:  Luke Orr is a 72 y.o. male who presents for Chief Complaint  Patient presents with   feels something stuck in ear    Feels like something stuck in ear. Like something carcoal got in his ear when under the car     Here for possible foreign body in ear.  Was under a car cleaning a gas filter and thinks a small 3-13m size piece of charcoal fell down in left ear.    Does have some drainage and cough of late, not sick feeling   No other aggravating or relieving factors.    No other c/o.  The following portions of the patient's history were reviewed and updated as appropriate: allergies, current medications, past family history, past medical history, past social history, past surgical history and problem list.  ROS Otherwise as in subjective above  Objective: BP 130/82   Pulse 62   Wt 219 lb 9.6 oz (99.6 kg)   BMI 34.39 kg/m   General appearance: alert, no distress, well developed, well nourished HEENT: normocephalic, sclerae anicteric, conjunctiva pink and moist, left TM with some air fluid levels, no erythema or obvious foreign body in canal, right TM and canal normal, nares patent, no discharge or erythema, pharynx normal Oral cavity: MMM, no lesions Neck: supple, no lymphadenopathy, no thyromegaly, no masses   Assessment: Encounter Diagnoses  Name Primary?   Ear discomfort, left Yes   Foreign body sensation in ear canal, left      Plan: We discussed concerns.  No obvious foreign body in left ear canal.  We did do a warm water lavage and still no small piece of charcoal present.  Reassured.  DLuchianowas seen today for feels something stuck in ear.  Diagnoses and all orders for this visit:  Ear discomfort, left  Foreign body sensation in ear canal, left    Follow up: prn

## 2022-05-05 ENCOUNTER — Other Ambulatory Visit: Payer: Self-pay | Admitting: Medical

## 2022-05-07 NOTE — Telephone Encounter (Signed)
Left message for pt to call back. Due for an appointment

## 2022-05-16 ENCOUNTER — Ambulatory Visit: Payer: Medicare Other | Admitting: Medical

## 2022-05-21 ENCOUNTER — Ambulatory Visit (INDEPENDENT_AMBULATORY_CARE_PROVIDER_SITE_OTHER): Payer: Medicare Other | Admitting: Medical

## 2022-05-21 VITALS — BP 126/70 | HR 83 | Wt 221.0 lb

## 2022-05-21 DIAGNOSIS — E785 Hyperlipidemia, unspecified: Secondary | ICD-10-CM

## 2022-05-21 DIAGNOSIS — R7301 Impaired fasting glucose: Secondary | ICD-10-CM

## 2022-05-21 DIAGNOSIS — I700 Atherosclerosis of aorta: Secondary | ICD-10-CM

## 2022-05-21 DIAGNOSIS — I10 Essential (primary) hypertension: Secondary | ICD-10-CM | POA: Diagnosis not present

## 2022-05-21 DIAGNOSIS — N401 Enlarged prostate with lower urinary tract symptoms: Secondary | ICD-10-CM

## 2022-05-21 DIAGNOSIS — Z7185 Encounter for immunization safety counseling: Secondary | ICD-10-CM | POA: Diagnosis not present

## 2022-05-21 DIAGNOSIS — R972 Elevated prostate specific antigen [PSA]: Secondary | ICD-10-CM

## 2022-05-21 LAB — POCT GLYCOSYLATED HEMOGLOBIN (HGB A1C): Hemoglobin A1C: 6.3 % — AB (ref 4.0–5.6)

## 2022-05-21 NOTE — Progress Notes (Signed)
Subjective:  Luke Orr is a 72 y.o. male who presents for Chief Complaint  Patient presents with   Hypertension     Here for med check.  Hypertension-compliant with amlodipine 10 mg daily, lisinopril HCT 20/12.5 mg daily without complaint.  No chest pain, no palpitations, no edema.  Hyperlipidemia-compliant with Crestor 20 mg daily without complaint  Impaired glucose, prediabetes-compliant with metformin 500 mg daily.  Does not have a glucometer.  No particular issues, no similar concerns  Elevated PSA-saw urology back in July.  No biopsy done at that time.  No other aggravating or relieving factors.    No other c/o.  Past Medical History:  Diagnosis Date   Abnormal abdominal ultrasound 03/2018   no AAA, atherosclerosis present   Arthritis    right knee   BPH (benign prostatic hypertrophy)    with urinary urgency and weak stream, followed by Luke Orr, [facility]   Bulging lumbar disc    self reported   DDD (degenerative disc disease), lumbar    Elevated PSA    Luke Orr, [facility]   Full dentures    H/O echocardiogram 03/2018   [facility], mod concetric LVH, EF 58%. mild left atrial enlargement   Hyperlipidemia    Hypertension    Impaired fasting blood sugar    Obesity    Obesity    Current Outpatient Medications on File Prior to Visit  Medication Sig Dispense Refill   amLODipine (NORVASC) 10 MG tablet TAKE 1 TABLET BY MOUTH EVERY DAY 90 tablet 2   aspirin EC 81 MG tablet Take 1 tablet (81 mg total) by mouth daily. 90 tablet 3   cetirizine (ZYRTEC) 10 MG tablet Take 1 tablet (10 mg total) by mouth at bedtime. 30 tablet 11   cholecalciferol (VITAMIN D3) 25 MCG (1000 UNIT) tablet Take 1,000 Units by mouth daily.     lisinopril-hydrochlorothiazide (ZESTORETIC) 20-12.5 MG tablet TAKE 1 TABLET BY MOUTH EVERY DAY 90 tablet 1   metFORMIN (GLUCOPHAGE) 500 MG tablet TAKE 1 TABLET BY MOUTH EVERY DAY WITH BREAKFAST 30 tablet 0    rosuvastatin (CRESTOR) 20 MG tablet TAKE 1 TABLET BY MOUTH EVERY DAY 90 tablet 0   vitamin E 1000 UNIT capsule Take 1,000 Units by mouth daily.     No current facility-administered medications on file prior to visit.     The following portions of the patient's history were reviewed and updated as appropriate: allergies, current medications, past family history, past medical history, past social history, past surgical history and problem list.  ROS Otherwise as in subjective above  Objective: BP 126/70   Pulse 83   Wt 221 lb (100.2 kg)   BMI 34.61 kg/m   General appearance: alert, no distress, well developed, well nourished Heart: RRR, normal S1, S2, no murmurs Lungs: CTA bilaterally, no wheezes, rhonchi, or rales Pulses: 2+ radial pulses, 2+ pedal pulses, normal cap refill Ext: no edema   Assessment: Encounter Diagnoses  Name Primary?   Essential hypertension Yes   Impaired fasting blood sugar    Atherosclerosis of aorta (HCC)    Hyperlipidemia, unspecified hyperlipidemia type    Elevated PSA    Benign prostatic hyperplasia with lower urinary tract symptoms, symptom details unspecified    Vaccine counseling      Plan: Hypertension-continue current medication, blood pressure goal  Impaired glucose-updated labs today, continue metformin once daily for prediabetes  Atherosclerosis, hyperlipidemia-continue statin.  He is nonfasting today.  He is due for lipid panel  which he  will need to do at a different date when he is fasting  BPH, elevated PSA-I reviewed his July 2023 urology notes.  Updated labs today but we will forward to urology  Advised updated shingles vaccine at his local pharmacy   Luke Orr was seen today for hypertension.  Diagnoses and all orders for this visit:  Essential hypertension -     Comprehensive metabolic panel  Impaired fasting blood sugar -     Comprehensive metabolic panel -     HgB 123456  Atherosclerosis of aorta  (HCC)  Hyperlipidemia, unspecified hyperlipidemia type  Elevated PSA -     CBC with Differential/Platelet -     Comprehensive metabolic panel -     PSA, total and free  Benign prostatic hyperplasia with lower urinary tract symptoms, symptom details unspecified  Vaccine counseling    Follow up: Pending labs

## 2022-05-22 ENCOUNTER — Other Ambulatory Visit: Payer: Self-pay | Admitting: Medical

## 2022-05-22 DIAGNOSIS — I10 Essential (primary) hypertension: Secondary | ICD-10-CM

## 2022-05-22 MED ORDER — ROSUVASTATIN CALCIUM 20 MG PO TABS: 20.0000 mg | ORAL_TABLET | Freq: Every day | ORAL | 3 refills | Status: DC

## 2022-05-22 MED ORDER — LISINOPRIL-HYDROCHLOROTHIAZIDE 20-12.5 MG PO TABS: 1.0000 | ORAL_TABLET | Freq: Every day | ORAL | 3 refills | Status: DC

## 2022-05-22 MED ORDER — METFORMIN HCL 500 MG PO TABS: ORAL_TABLET | ORAL | 1 refills | Status: DC

## 2022-05-22 MED ORDER — AMLODIPINE BESYLATE 10 MG PO TABS: 10.0000 mg | ORAL_TABLET | Freq: Every day | ORAL | 3 refills | Status: DC

## 2022-05-22 NOTE — Progress Notes (Signed)
See if the lab can add a hemoglobin A1c?  If not we will do it at his next visit  Schedule physical in 6 months

## 2022-05-24 LAB — CBC WITH DIFFERENTIAL/PLATELET
Basophils Absolute: 0 10*3/uL (ref 0.0–0.2)
Basos: 1 %
EOS (ABSOLUTE): 0.5 10*3/uL — ABNORMAL HIGH (ref 0.0–0.4)
Eos: 9 %
Hematocrit: 42.4 % (ref 37.5–51.0)
Hemoglobin: 14 g/dL (ref 13.0–17.7)
Immature Grans (Abs): 0 10*3/uL (ref 0.0–0.1)
Immature Granulocytes: 0 %
Lymphocytes Absolute: 1.8 10*3/uL (ref 0.7–3.1)
Lymphs: 29 %
MCH: 29.2 pg (ref 26.6–33.0)
MCHC: 33 g/dL (ref 31.5–35.7)
MCV: 88 fL (ref 79–97)
Monocytes Absolute: 0.5 10*3/uL (ref 0.1–0.9)
Monocytes: 9 %
Neutrophils Absolute: 3.2 10*3/uL (ref 1.4–7.0)
Neutrophils: 52 %
Platelets: 324 10*3/uL (ref 150–450)
RBC: 4.8 x10E6/uL (ref 4.14–5.80)
RDW: 12.8 % (ref 11.6–15.4)
WBC: 6 10*3/uL (ref 3.4–10.8)

## 2022-05-24 LAB — COMPREHENSIVE METABOLIC PANEL
ALT: 17 IU/L (ref 0–44)
AST: 21 IU/L (ref 0–40)
Albumin/Globulin Ratio: 1.8 (ref 1.2–2.2)
Albumin: 4.4 g/dL (ref 3.9–4.9)
Alkaline Phosphatase: 112 IU/L (ref 44–121)
BUN/Creatinine Ratio: 17 (ref 10–24)
BUN: 13 mg/dL (ref 8–27)
Bilirubin Total: 0.6 mg/dL (ref 0.0–1.2)
CO2: 25 mmol/L (ref 20–29)
Calcium: 10 mg/dL (ref 8.6–10.2)
Chloride: 101 mmol/L (ref 96–106)
Creatinine, Ser: 0.77 mg/dL (ref 0.76–1.27)
Globulin, Total: 2.4 g/dL (ref 1.5–4.5)
Glucose: 160 mg/dL — ABNORMAL HIGH (ref 70–99)
Potassium: 3.7 mmol/L (ref 3.5–5.2)
Sodium: 141 mmol/L (ref 134–144)
Total Protein: 6.8 g/dL (ref 6.0–8.5)
eGFR: 97 mL/min/{1.73_m2} (ref >59)

## 2022-05-24 LAB — PSA, TOTAL AND FREE
PSA, Free Pct: 16.3 %
PSA, Free: 2.63 ng/mL
Prostate Specific Ag, Serum: 16.1 ng/mL — ABNORMAL HIGH (ref 0.0–4.0)

## 2022-05-24 NOTE — Progress Notes (Signed)
PSA test slightly higher than the last time.  Forward results to urology

## 2022-06-06 ENCOUNTER — Other Ambulatory Visit: Payer: Self-pay | Admitting: Medical

## 2022-06-06 MED ORDER — ASPIRIN 81 MG PO TBEC
81.0000 mg | DELAYED_RELEASE_TABLET | Freq: Every day | ORAL | 0 refills | Status: DC
Start: 2022-06-06 — End: 2022-09-05

## 2022-06-06 NOTE — Addendum Note (Signed)
Addended by: Minette Headland A on: 06/06/2022 02:01 PM   Modules accepted: Orders

## 2022-09-05 ENCOUNTER — Other Ambulatory Visit: Payer: Self-pay | Admitting: Medical

## 2022-10-10 ENCOUNTER — Ambulatory Visit: Payer: Medicare Other | Admitting: Family Medicine

## 2022-10-10 VITALS — BP 120/70 | HR 59 | Temp 97.2°F | Wt 221.0 lb

## 2022-10-10 DIAGNOSIS — M545 Low back pain, unspecified: Secondary | ICD-10-CM

## 2022-10-10 DIAGNOSIS — R7303 Prediabetes: Secondary | ICD-10-CM

## 2022-10-10 NOTE — Patient Instructions (Addendum)
Low Back Sprain or Strain Rehab Ask your health care provider which exercises are safe for you. Do exercises exactly as told by your health care provider and adjust them as directed. It is normal to feel mild stretching, pulling, tightness, or discomfort as you do these exercises. Stop right away if you feel sudden pain or your pain gets worse. Do not begin these exercises until told by your health care provider. Stretching and range-of-motion exercises These exercises warm up your muscles and joints and improve the movement and flexibility of your back. These exercises also help to relieve pain, numbness, and tingling. Lumbar rotation  Lie on your back on a firm bed or the floor with your knees bent. Straighten your arms out to your sides so each arm forms a 90-degree angle (right angle) with a side of your body. Slowly move (rotate) both of your knees to one side of your body until you feel a stretch in your lower back (lumbar). Try not to let your shoulders lift off the floor. Hold this position for __________ seconds. Tense your abdominal muscles and slowly move your knees back to the starting position. Repeat this exercise on the other side of your body. Repeat __________ times. Complete this exercise __________ times a day. Single knee to chest  Lie on your back on a firm bed or the floor with both legs straight. Bend one of your knees. Use your hands to move your knee up toward your chest until you feel a gentle stretch in your lower back and buttock. Hold your leg in this position by holding on to the front of your knee. Keep your other leg as straight as possible. Hold this position for __________ seconds. Slowly return to the starting position. Repeat with your other leg. Repeat __________ times. Complete this exercise __________ times a day. Prone extension on elbows  Lie on your abdomen on a firm bed or the floor (prone position). Prop yourself up on your elbows. Use your arms  to help lift your chest up until you feel a gentle stretch in your abdomen and your lower back. This will place some of your body weight on your elbows. If this is uncomfortable, try stacking pillows under your chest. Your hips should stay down, against the surface that you are lying on. Keep your hip and back muscles relaxed. Hold this position for __________ seconds. Slowly relax your upper body and return to the starting position. Repeat __________ times. Complete this exercise __________ times a day. Strengthening exercises These exercises build strength and endurance in your back. Endurance is the ability to use your muscles for a long time, even after they get tired. Pelvic tilt This exercise strengthens the muscles that lie deep in the abdomen. Lie on your back on a firm bed or the floor with your legs extended. Bend your knees so they are pointing toward the ceiling and your feet are flat on the floor. Tighten your lower abdominal muscles to press your lower back against the floor. This motion will tilt your pelvis so your tailbone points up toward the ceiling instead of pointing to your feet or the floor. To help with this exercise, you may place a small towel under your lower back and try to push your back into the towel. Hold this position for __________ seconds. Let your muscles relax completely before you repeat this exercise. Repeat __________ times. Complete this exercise __________ times a day. Alternating arm and leg raises  Get on your hands  and knees on a firm surface. If you are on a hard floor, you may want to use padding, such as an exercise mat, to cushion your knees. Line up your arms and legs. Your hands should be directly below your shoulders, and your knees should be directly below your hips. Lift your left leg behind you. At the same time, raise your right arm and straighten it in front of you. Do not lift your leg higher than your hip. Do not lift your arm higher  than your shoulder. Keep your abdominal and back muscles tight. Keep your hips facing the ground. Do not arch your back. Keep your balance carefully, and do not hold your breath. Hold this position for __________ seconds. Slowly return to the starting position. Repeat with your right leg and your left arm. Repeat __________ times. Complete this exercise __________ times a day. Abdominal set with straight leg raise  Lie on your back on a firm bed or the floor. Bend one of your knees and keep your other leg straight. Tense your abdominal muscles and lift your straight leg up, 4-6 inches (10-15 cm) off the ground. Keep your abdominal muscles tight and hold this position for __________ seconds. Do not hold your breath. Do not arch your back. Keep it flat against the ground. Keep your abdominal muscles tense as you slowly lower your leg back to the starting position. Repeat with your other leg. Repeat __________ times. Complete this exercise __________ times a day. Single leg lower with bent knees Lie on your back on a firm bed or the floor. Tense your abdominal muscles and lift your feet off the floor, one foot at a time, so your knees and hips are bent in 90-degree angles (right angles). Your knees should be over your hips and your lower legs should be parallel to the floor. Keeping your abdominal muscles tense and your knee bent, slowly lower one of your legs so your toe touches the ground. Lift your leg back up to return to the starting position. Do not hold your breath. Do not let your back arch. Keep your back flat against the ground. Repeat with your other leg. Repeat __________ times. Complete this exercise __________ times a day. Posture and body mechanics Good posture and healthy body mechanics can help to relieve stress in your body's tissues and joints. Body mechanics refers to the movements and positions of your body while you do your daily activities. Posture is part of body  mechanics. Good posture means: Your spine is in its natural S-curve position (neutral). Your shoulders are pulled back slightly. Your head is not tipped forward (neutral). Follow these guidelines to improve your posture and body mechanics in your everyday activities. Standing  When standing, keep your spine neutral and your feet about hip-width apart. Keep a slight bend in your knees. Your ears, shoulders, and hips should line up. When you do a task in which you stand in one place for a long time, place one foot up on a stable object that is 2-4 inches (5-10 cm) high, such as a footstool. This helps keep your spine neutral. Sitting  When sitting, keep your spine neutral and keep your feet flat on the floor. Use a footrest, if necessary, and keep your thighs parallel to the floor. Avoid rounding your shoulders, and avoid tilting your head forward. When working at a desk or a computer, keep your desk at a height where your hands are slightly lower than your elbows. Slide your  chair under your desk so you are close enough to maintain good posture. When working at a computer, place your monitor at a height where you are looking straight ahead and you do not have to tilt your head forward or downward to look at the screen. Resting When lying down and resting, avoid positions that are most painful for you. If you have pain with activities such as sitting, bending, stooping, or squatting, lie in a position in which your body does not bend very much. For example, avoid curling up on your side with your arms and knees near your chest (fetal position). If you have pain with activities such as standing for a long time or reaching with your arms, lie with your spine in a neutral position and bend your knees slightly. Try the following positions: Lying on your side with a pillow between your knees. Lying on your back with a pillow under your knees. Lifting  When lifting objects, keep your feet at least  shoulder-width apart and tighten your abdominal muscles. Bend your knees and hips and keep your spine neutral. It is important to lift using the strength of your legs, not your back. Do not lock your knees straight out. Always ask for help to lift heavy or awkward objects. This information is not intended to replace advice given to you by your health care provider. Make sure you discuss any questions you have with your health care provider.   You can do heat to your back for 20 minutes 3 times per day and gentle stretching as they point out and the other section here and you can take 2 Aleve twice per day for the next week or so to quiet everything down Document Revised: 06/25/2022 Document Reviewed: 05/09/2020 Elsevier Patient Education  2024 [employer].
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
## 2022-11-03 ENCOUNTER — Other Ambulatory Visit: Payer: Self-pay | Admitting: Medical

## 2022-11-20 ENCOUNTER — Ambulatory Visit (INDEPENDENT_AMBULATORY_CARE_PROVIDER_SITE_OTHER): Payer: Medicare Other

## 2022-11-20 DIAGNOSIS — Z0000 Encounter for general adult medical examination without abnormal findings: Secondary | ICD-10-CM

## 2022-11-20 NOTE — Patient Instructions (Addendum)
Mr. Kois , Thank you for taking time to come for your Medicare Wellness Visit. I appreciate your ongoing commitment to your health goals. Please review the following plan we discussed and let me know if I can assist you in the future.   Referrals/Orders/Follow-Ups/Clinician Recommendations: none  This is a list of the screening recommended for you and due dates:  Health Maintenance  Topic Date Due   Zoster (Shingles) Vaccine (1 of 2) Never done   Flu Shot  10/04/2022   COVID-19 Vaccine (5 - 2023-24 season) 11/04/2022   Medicare Annual Wellness Visit  11/20/2023   Colon Cancer Screening  12/16/2023   DTaP/Tdap/Td vaccine (2 - Td or Tdap) 09/14/2024   Pneumonia Vaccine (3 of 3 - PPSV23 or PCV20) 09/23/2024   Hepatitis C Screening  Completed   HPV Vaccine  Aged Out    Advanced directives: (Copy Requested) Please bring a copy of your health care power of attorney and living will to the office to be added to your chart at your convenience.  Next Medicare Annual Wellness Visit scheduled for next year: Yes  insert Preventive Care attachment Insert FALL PREVENTION attachment if needed

## 2022-11-20 NOTE — Progress Notes (Signed)
Subjective:   Luke Orr is a 72 y.o. male who presents for Medicare Annual/Subsequent preventive examination.  Visit Complete: Virtual  I connected with  Luke Orr on 11/20/22 by a audio enabled telemedicine application and verified that I am speaking with the correct person using two identifiers.  Patient Location: Home  Provider Location: Office/Clinic  I discussed the limitations of evaluation and management by telemedicine. The patient expressed understanding and agreed to proceed.  Vital Signs: Unable to obtain new vitals due to this being a telehealth visit.  Cardiac Risk Factors include: advanced age (>63men, >102 women);dyslipidemia;hypertension;male gender     Objective:    Today's Vitals   There is no height or weight on file to calculate BMI.     11/20/2022   11:05 AM 11/17/2021    8:45 AM 12/15/2013    1:44 PM 12/11/2013    3:29 PM  Advanced Directives  Does Patient Have a Medical Advance Directive? Yes No No No  Type of Estate agent of [place];Living will     Copy of Healthcare Power of Attorney in Chart? No - copy requested     Would patient like information on creating a medical advance directive?   No - patient declined information     Current Medications (verified) Outpatient Encounter Medications as of 11/20/2022  Medication Sig   amLODipine (NORVASC) 10 MG tablet Take 1 tablet (10 mg total) by mouth daily.   ASPIRIN LOW DOSE 81 MG tablet TAKE 1 TABLET BY MOUTH EVERY DAY   cetirizine (ZYRTEC) 10 MG tablet Take 1 tablet (10 mg total) by mouth at bedtime.   cholecalciferol (VITAMIN D3) 25 MCG (1000 UNIT) tablet Take 1,000 Units by mouth daily.   lisinopril-hydrochlorothiazide (ZESTORETIC) 20-12.5 MG tablet Take 1 tablet by mouth daily.   metFORMIN (GLUCOPHAGE) 500 MG tablet TAKE 1 TABLET BY MOUTH EVERY DAY WITH BREAKFAST   rosuvastatin (CRESTOR) 20 MG tablet Take 1 tablet (20 mg total) by mouth daily.   vitamin E 1000  UNIT capsule Take 1,000 Units by mouth daily.   No facility-administered encounter medications on file as of 11/20/2022.    Allergies (verified) Morphine and codeine   History: Past Medical History:  Diagnosis Date   Abnormal abdominal ultrasound 03/2018   no AAA, atherosclerosis present   Arthritis    right knee   BPH (benign prostatic hypertrophy)    with urinary urgency and weak stream, followed by Dr. Bjorn Pippin, [facility]   Bulging lumbar disc    self reported   DDD (degenerative disc disease), lumbar    Elevated PSA    Dr. Annabell Howells, [facility]   Full dentures    H/O echocardiogram 03/2018   [facility], mod concetric LVH, EF 58%. mild left atrial enlargement   Hyperlipidemia    Hypertension    Impaired fasting blood sugar    Obesity    Obesity    Past Surgical History:  Procedure Laterality Date   COLONOSCOPY  2015   mild diverticulosis, repeat 2025; Dr. Deberah Castle HERNIA REPAIR Right 1995   PROSTATE BIOPSY  2014   x 2; Dr. Annabell Howells   Family History  Problem Relation Age of Onset   Heart disease Mother    Diabetes Mother    Hypertension Mother    Heart disease Father    Hypertension Sister    Diabetes Sister    Hypertension Sister    Diabetes Sister    Hypertension Sister  Diabetes Sister    Hypertension Sister    Hypertension Sister    Aneurysm Sister        brain   Hypertension Brother    Cancer Brother 73       prostate   Heart disease Brother 15       CABG, died of CHF   Hypertension Brother    Diabetes Brother    Colon cancer Neg Hx    Stroke Neg Hx    Sleep apnea Neg Hx    Social History   Socioeconomic History   Marital status: Married    Spouse name: Not on file   Number of children: 3   Years of education: Not on file   Highest education level: Not on file  Occupational History   Not on file  Tobacco Use   Smoking status: Former    Current packs/day: 0.00    Average packs/day: 1 pack/day for 5.0  years (5.0 ttl pk-yrs)    Types: Cigarettes    Start date: 03/05/1998    Quit date: 03/06/2003    Years since quitting: 19.7   Smokeless tobacco: Never  Vaping Use   Vaping status: Never Used  Substance and Sexual Activity   Alcohol use: Yes    Alcohol/week: 7.0 standard drinks of alcohol    Types: 7 Glasses of wine per week    Comment: red wine every night   Drug use: No   Sexual activity: Not on file  Other Topics Concern   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  As of 09/2019   Social Determinants of Health   Financial Resource Strain: Low Risk  (11/20/2022)   Overall Financial Resource Strain (CARDIA)    Difficulty of Paying Living Expenses: Not hard at all  Food Insecurity: No Food Insecurity (11/20/2022)   Hunger Vital Sign    Worried About Running Out of Food in the Last Year: Never true    Ran Out of Food in the Last Year: Never true  Transportation Needs: No Transportation Needs (11/20/2022)   PRAPARE - Administrator, Civil Service (Medical): No    Lack of Transportation (Non-Medical): No  Physical Activity: Insufficiently Active (11/20/2022)   Exercise Vital Sign    Days of Exercise per Week: 7 days    Minutes of Exercise per Session: 20 min  Stress: No Stress Concern Present (11/20/2022)   [employer] of Occupational Health - Occupational Stress Questionnaire    Feeling of Stress : Not at all  Social Connections: Moderately Isolated (11/20/2022)   Social Connection and Isolation Panel [NHANES]    Frequency of Communication with Friends and Family: More than three times a week    Frequency of Social Gatherings with Friends and Family: Twice a week    Attends Religious Services: Never    Database administrator or Organizations: No    Attends Engineer, structural: Never    Marital Status: Married    Tobacco Counseling Counseling given: Not  Answered   Clinical Intake:  Pre-visit preparation completed: Yes  Pain : No/denies pain     Nutritional Risks: None Diabetes: No  How often do you need to have someone help you when you read instructions, pamphlets, or other written materials from your doctor or pharmacy?: 1 - Never  Interpreter Needed?: No  Information entered by :: NAllen LPN  Activities of Daily Living    11/20/2022   11:01 AM  In your present state of health, do you have any difficulty performing the following activities:  Hearing? 0  Vision? 0  Difficulty concentrating or making decisions? 0  Walking or climbing stairs? 0  Dressing or bathing? 0  Doing errands, shopping? 0  Preparing Food and eating ? N  Using the Toilet? N  In the past six months, have you accidently leaked urine? N  Do you have problems with loss of bowel control? N  Managing your Medications? N  Managing your Finances? N  Housekeeping or managing your Housekeeping? N    Patient Care Team: Tysinger, Kermit Balo, PA-C as PCP - General (Family Medicine) Bjorn Pippin, MD as Attending Physician (Urology)  Indicate any recent Medical Services you may have received from other than [facility] providers in the past year (date may be approximate).     Assessment:   This is a routine wellness examination for Cadin.  Hearing/Vision screen Hearing Screening - Comments:: Denies hearing issues Vision Screening - Comments:: No regular eye exams, [facility]   Goals Addressed             This Visit's Progress    Patient Stated       11/20/2022, not to gain any weight       Depression Screen    11/20/2022   11:07 AM 05/21/2022    2:48 PM 11/17/2021    8:46 AM 07/11/2021   10:21 AM 12/13/2020    9:04 AM 11/03/2020   10:30 AM 09/24/2019   12:00 PM  PHQ 2/9 Scores  PHQ - 2 Score 0 0 0 0 0 0 0  PHQ- 9 Score 0  0        Fall Risk    11/20/2022   11:06 AM 05/21/2022    2:48 PM 11/17/2021    8:46 AM 07/11/2021   10:21 AM 12/13/2020    9:04  AM  Fall Risk   Falls in the past year? 0 0 0 0 0  Number falls in past yr: 0 0 0 0 0  Injury with Fall? 0 0 0 0 0  Risk for fall due to : Medication side effect No Fall Risks Medication side effect No Fall Risks No Fall Risks  Follow up Falls prevention discussed;Falls evaluation completed Falls evaluation completed Falls prevention discussed;Education provided;Falls evaluation completed Falls evaluation completed Falls evaluation completed    MEDICARE RISK AT HOME: Medicare Risk at Home Any stairs in or around the home?: No If so, are there any without handrails?: No Home free of loose throw rugs in walkways, pet beds, electrical cords, etc?: Yes Adequate lighting in your home to reduce risk of falls?: Yes Life alert?: No Use of a cane, walker or w/c?: No Grab bars in the bathroom?: Yes Shower chair or bench in shower?: No Elevated toilet seat or a handicapped toilet?: Yes  TIMED UP AND GO:  Was the test performed?  No    Cognitive Function:        11/20/2022   11:08 AM 11/17/2021    8:47 AM  6CIT Screen  What Year? 0 points 0 points  What month? 0 points 0 points  What time? 3 points 0 points  Count back from 20 0 points 0 points  Months in reverse 4 points 4 points  Repeat phrase 2 points 8 points  Total Score 9 points 12 points    Immunizations Immunization History  Administered  Date(s) Administered   Fluad Quad(high Dose 65+) 11/18/2019, 11/03/2020   Moderna Covid-19 Vaccine Bivalent Booster 63yrs & up 12/13/2020   Moderna Sars-Covid-2 Vaccination 04/19/2019, 05/17/2019, 02/01/2020   Pneumococcal Conjugate-13 09/24/2019   Pneumococcal Polysaccharide-23 12/15/2015   Tdap 09/15/2014    TDAP status: Up to date  Flu Vaccine status: Due, Education has been provided regarding the importance of this vaccine. Advised may receive this vaccine at local pharmacy or Health Dept. Aware to provide a copy of the vaccination record if obtained from local pharmacy or Health  Dept. Verbalized acceptance and understanding.  Pneumococcal vaccine status: Up to date  Covid-19 vaccine status: Information provided on how to obtain vaccines.   Qualifies for Shingles Vaccine? Yes   Zostavax completed No   Shingrix Completed?: No.    Education has been provided regarding the importance of this vaccine. Patient has been advised to call insurance company to determine out of pocket expense if they have not yet received this vaccine. Advised may also receive vaccine at local pharmacy or Health Dept. Verbalized acceptance and understanding.  Screening Tests Health Maintenance  Topic Date Due   Zoster Vaccines- Shingrix (1 of 2) Never done   INFLUENZA VACCINE  10/04/2022   COVID-19 Vaccine (5 - 2023-24 season) 11/04/2022   Medicare Annual Wellness (AWV)  11/20/2023   Colonoscopy  12/16/2023   DTaP/Tdap/Td (2 - Td or Tdap) 09/14/2024   Pneumonia Vaccine 68+ Years old (3 of 3 - PPSV23 or PCV20) 09/23/2024   Hepatitis C Screening  Completed   HPV VACCINES  Aged Out    Health Maintenance  Health Maintenance Due  Topic Date Due   Zoster Vaccines- Shingrix (1 of 2) Never done   INFLUENZA VACCINE  10/04/2022   COVID-19 Vaccine (5 - 2023-24 season) 11/04/2022    Colorectal cancer screening: Type of screening: Colonoscopy. Completed 12/15/2013. Repeat every 10 years  Lung Cancer Screening: (Low Dose CT Chest recommended if Age 70-80 years, 20 pack-year currently smoking OR have quit w/in 15years.) does not qualify.   Lung Cancer Screening Referral: no  Additional Screening:  Hepatitis C Screening: does qualify; Completed 12/07/2020  Vision Screening: Recommended annual ophthalmology exams for early detection of glaucoma and other disorders of the eye. Is the patient up to date with their annual eye exam?  Yes  Who is the provider or what is the name of the office in which the patient attends annual eye exams? [facility] If pt is not established with a provider, would  they like to be referred to a provider to establish care? No .   Dental Screening: Recommended annual dental exams for proper oral hygiene  Diabetic Foot Exam: n/a  Community Resource Referral / Chronic Care Management: CRR required this visit?  No   CCM required this visit?  No     Plan:     I have personally reviewed and noted the following in the patient's chart:   Medical and social history Use of alcohol, tobacco or illicit drugs  Current medications and supplements including opioid prescriptions. Patient is not currently taking opioid prescriptions. Functional ability and status Nutritional status Physical activity Advanced directives List of other physicians Hospitalizations, surgeries, and ER visits in previous 12 months Vitals Screenings to include cognitive, depression, and falls Referrals and appointments  In addition, I have reviewed and discussed with patient certain preventive protocols, quality metrics, and best practice recommendations. A written personalized care plan for preventive services as well as general preventive health recommendations were provided  to patient.     Barb Merino, LPN   [id number]   After Visit Summary: (MyChart) Due to this being a telephonic visit, the after visit summary with patients personalized plan was offered to patient via MyChart   Nurse Notes: none

## 2022-11-22 ENCOUNTER — Ambulatory Visit (INDEPENDENT_AMBULATORY_CARE_PROVIDER_SITE_OTHER): Payer: Medicare Other | Admitting: Medical

## 2022-11-22 ENCOUNTER — Encounter: Payer: Self-pay | Admitting: Medical

## 2022-11-22 VITALS — BP 124/70 | HR 57 | Ht 67.0 in | Wt 218.2 lb

## 2022-11-22 DIAGNOSIS — Z972 Presence of dental prosthetic device (complete) (partial): Secondary | ICD-10-CM | POA: Insufficient documentation

## 2022-11-22 DIAGNOSIS — I700 Atherosclerosis of aorta: Secondary | ICD-10-CM

## 2022-11-22 DIAGNOSIS — E785 Hyperlipidemia, unspecified: Secondary | ICD-10-CM | POA: Diagnosis not present

## 2022-11-22 DIAGNOSIS — R7303 Prediabetes: Secondary | ICD-10-CM | POA: Diagnosis not present

## 2022-11-22 DIAGNOSIS — Z7185 Encounter for immunization safety counseling: Secondary | ICD-10-CM

## 2022-11-22 DIAGNOSIS — Z131 Encounter for screening for diabetes mellitus: Secondary | ICD-10-CM

## 2022-11-22 DIAGNOSIS — I10 Essential (primary) hypertension: Secondary | ICD-10-CM

## 2022-11-22 DIAGNOSIS — Z0000 Encounter for general adult medical examination without abnormal findings: Secondary | ICD-10-CM | POA: Diagnosis not present

## 2022-11-22 DIAGNOSIS — R7301 Impaired fasting glucose: Secondary | ICD-10-CM | POA: Diagnosis not present

## 2022-11-22 DIAGNOSIS — R29898 Other symptoms and signs involving the musculoskeletal system: Secondary | ICD-10-CM | POA: Diagnosis not present

## 2022-11-22 DIAGNOSIS — Z23 Encounter for immunization: Secondary | ICD-10-CM | POA: Diagnosis not present

## 2022-11-22 DIAGNOSIS — M5136 Other intervertebral disc degeneration, lumbar region: Secondary | ICD-10-CM

## 2022-11-22 DIAGNOSIS — R5383 Other fatigue: Secondary | ICD-10-CM | POA: Diagnosis not present

## 2022-11-22 DIAGNOSIS — N401 Enlarged prostate with lower urinary tract symptoms: Secondary | ICD-10-CM

## 2022-11-22 LAB — POCT URINALYSIS DIP (PROADVANTAGE DEVICE)
Bilirubin, UA: NEGATIVE
Blood, UA: NEGATIVE
Glucose, UA: NEGATIVE mg/dL
Ketones, POC UA: NEGATIVE mg/dL
Leukocytes, UA: NEGATIVE
Nitrite, UA: NEGATIVE
Protein Ur, POC: NEGATIVE mg/dL
Specific Gravity, Urine: 1.01
Urobilinogen, Ur: NEGATIVE
pH, UA: 7.5 (ref 5.0–8.0)

## 2022-11-22 LAB — LIPID PANEL

## 2022-11-22 NOTE — Progress Notes (Signed)
Subjective:   HPI  Luke Orr is a 72 y.o. male who presents for Chief Complaint  Patient presents with   Annual Exam    Fasting cpe, no concerns, covid and flu shot given     Patient Care Team: Cathe Bilger, Kermit Balo, PA-C as PCP - General (Family Medicine) Bjorn Pippin, MD as Attending Physician (Urology)   Concerns: Here for well visit  Still notes ongoing leg weakness/aches at time.  Not necessarily related to activity.   Sometimes back pain.    No prior sleep study.    Gets fatigued sometimes.    Reviewed their medical, surgical, family, social, medication, and allergy history and updated chart as appropriate.  Allergies  Allergen Reactions   Morphine And Codeine Shortness Of Breath    Past Medical History:  Diagnosis Date   Abnormal abdominal ultrasound 03/2018   no AAA, atherosclerosis present   Arthritis    right knee   BPH (benign prostatic hypertrophy)    with urinary urgency and weak stream, followed by Dr. Bjorn Pippin, [facility]   Bulging lumbar disc    self reported   DDD (degenerative disc disease), lumbar    Elevated PSA    Dr. Annabell Howells, [facility]   Full dentures    H/O echocardiogram 03/2018   [facility], mod concetric LVH, EF 58%. mild left atrial enlargement   Hyperlipidemia    Hypertension    Impaired fasting blood sugar    Obesity    Obesity     Current Outpatient Medications on File Prior to Visit  Medication Sig Dispense Refill   amLODipine (NORVASC) 10 MG tablet Take 1 tablet (10 mg total) by mouth daily. 90 tablet 3   ASPIRIN LOW DOSE 81 MG tablet TAKE 1 TABLET BY MOUTH EVERY DAY 90 tablet 0   cetirizine (ZYRTEC) 10 MG tablet Take 1 tablet (10 mg total) by mouth at bedtime. 30 tablet 11   cholecalciferol (VITAMIN D3) 25 MCG (1000 UNIT) tablet Take 1,000 Units by mouth daily.     lisinopril-hydrochlorothiazide (ZESTORETIC) 20-12.5 MG tablet Take 1 tablet by mouth daily. 90 tablet 3   metFORMIN (GLUCOPHAGE) 500  MG tablet TAKE 1 TABLET BY MOUTH EVERY DAY WITH BREAKFAST 90 tablet 1   rosuvastatin (CRESTOR) 20 MG tablet Take 1 tablet (20 mg total) by mouth daily. 90 tablet 3   vitamin E 1000 UNIT capsule Take 1,000 Units by mouth daily.     No current facility-administered medications on file prior to visit.      Current Outpatient Medications:    amLODipine (NORVASC) 10 MG tablet, Take 1 tablet (10 mg total) by mouth daily., Disp: 90 tablet, Rfl: 3   ASPIRIN LOW DOSE 81 MG tablet, TAKE 1 TABLET BY MOUTH EVERY DAY, Disp: 90 tablet, Rfl: 0   cetirizine (ZYRTEC) 10 MG tablet, Take 1 tablet (10 mg total) by mouth at bedtime., Disp: 30 tablet, Rfl: 11   cholecalciferol (VITAMIN D3) 25 MCG (1000 UNIT) tablet, Take 1,000 Units by mouth daily., Disp: , Rfl:    lisinopril-hydrochlorothiazide (ZESTORETIC) 20-12.5 MG tablet, Take 1 tablet by mouth daily., Disp: 90 tablet, Rfl: 3   metFORMIN (GLUCOPHAGE) 500 MG tablet, TAKE 1 TABLET BY MOUTH EVERY DAY WITH BREAKFAST, Disp: 90 tablet, Rfl: 1   rosuvastatin (CRESTOR) 20 MG tablet, Take 1 tablet (20 mg total) by mouth daily., Disp: 90 tablet, Rfl: 3   vitamin E 1000 UNIT capsule, Take 1,000 Units by mouth daily., Disp: , Rfl:  Family History  Problem Relation Age of Onset   Heart disease Mother    Diabetes Mother    Hypertension Mother    Heart disease Father    Hypertension Sister    Diabetes Sister    Hypertension Sister    Diabetes Sister    Hypertension Sister    Diabetes Sister    Hypertension Sister    Hypertension Sister    Aneurysm Sister        brain   Hypertension Brother    Cancer Brother 19       prostate   Heart disease Brother 72       CABG, died of CHF   Hypertension Brother    Diabetes Brother    Colon cancer Neg Hx    Stroke Neg Hx    Sleep apnea Neg Hx     Past Surgical History:  Procedure Laterality Date   COLONOSCOPY  2015   mild diverticulosis, repeat 2025; Dr. Deberah Castle HERNIA REPAIR Right 1995   PROSTATE  BIOPSY  2014   x 2; Dr. Annabell Howells   Review of Systems  Constitutional:  Positive for malaise/fatigue. Negative for chills, fever and weight loss.  HENT:  Negative for congestion, ear pain, hearing loss, sore throat and tinnitus.   Eyes:  Negative for blurred vision, pain and redness.  Respiratory:  Negative for cough, hemoptysis and shortness of breath.   Cardiovascular:  Negative for chest pain, palpitations, orthopnea, claudication and leg swelling.  Gastrointestinal:  Negative for abdominal pain, blood in stool, constipation, diarrhea, nausea and vomiting.  Genitourinary:  Negative for dysuria, flank pain, frequency, hematuria and urgency.  Musculoskeletal:  Positive for back pain and myalgias. Negative for falls and joint pain.  Skin:  Negative for itching and rash.  Neurological:  Negative for dizziness, tingling, speech change, weakness and headaches.  Endo/Heme/Allergies:  Negative for polydipsia. Does not bruise/bleed easily.  Psychiatric/Behavioral:  Negative for depression and memory loss. The patient is not nervous/anxious and does not have insomnia.       Objective:  BP 124/70   Pulse (!) 57   Ht 5\' 7"  (1.702 m)   Wt 218 lb 3.2 oz (99 kg)   BMI 34.17 kg/m   General appearance: alert, no distress, WD/WN, African American male Skin: unremarkable HEENT: normocephalic, conjunctiva/corneas normal, sclerae anicteric, PERRLA, EOMi, left nare inferiorly with 4mm lump or polyp unchanged for years per patient, otherwise no discharge or erythema, pharynx normal Oral cavity: MMM, tongue normal, teeth - dentures Neck: supple, no lymphadenopathy, no thyromegaly, no masses, normal ROM, no bruits Chest: non tender, normal shape and expansion Heart: RRR, normal S1, S2, no murmurs Lungs: CTA bilaterally, no wheezes, rhonchi, or rales Abdomen: +bs, soft, non tender, non distended, no masses, no hepatomegaly, no splenomegaly, no bruits Back: non tender, normal ROM, no  scoliosis Musculoskeletal: upper extremities non tender, no obvious deformity, normal ROM throughout, lower extremities non tender, no obvious deformity, normal ROM throughout Extremities: no edema, no cyanosis, no clubbing Pulses: 2+ symmetric, upper and lower extremities, normal cap refill Neurological: alert, oriented x 3, CN2-12 intact, strength normal upper extremities and lower extremities, sensation normal throughout, DTRs 2+ throughout, no cerebellar signs, gait normal Psychiatric: normal affect, behavior normal, pleasant  GU/rectal - declined, deferred    Assessment and Plan :   Encounter Diagnoses  Name Primary?   Encounter for health maintenance examination in adult Yes   Needs flu shot    Essential hypertension  Atherosclerosis of aorta (HCC)    Benign prostatic hyperplasia with lower urinary tract symptoms, symptom details unspecified    Fatigue, unspecified type    Hyperlipidemia, unspecified hyperlipidemia type    Impaired fasting blood sugar    Vaccine counseling    Screening for diabetes mellitus    Prediabetes    Lumbar degenerative disc disease    Wears dentures    Weakness of both lower extremities     This visit was a preventative care visit, also known as wellness visit or routine physical.   Topics typically include healthy lifestyle, diet, exercise, preventative care, vaccinations, sick and well care, proper use of emergency dept and after hours care, as well as other concerns.     Separate significant issues discussed: HTN - continue current medication  Hyperlipidemia - continue current medicaiton, labs today  Impaired glucose - labs today, continue metformin  Fatigue, leg weakness Labs today Consider sleep study You have normal strength today  You had normal ABI blood flow screen 2 years ago   General Recommendations: Continue to return yearly for your annual wellness and preventative care visits.  This gives [place] a chance to discuss healthy  lifestyle, exercise, vaccinations, review your chart record, and perform screenings where appropriate.  I recommend you see your eye doctor yearly for routine vision care.   Vaccination  Immunization History  Administered Date(s) Administered   Fluad Quad(high Dose 65+) 11/18/2019, 11/03/2020   Fluad Trivalent(High Dose 65+) 11/22/2022   Moderna Covid-19 Vaccine Bivalent Booster 67yrs & up 12/13/2020   Moderna Sars-Covid-2 Vaccination 04/19/2019, 05/17/2019, 02/01/2020   Pneumococcal Conjugate-13 09/24/2019   Pneumococcal Polysaccharide-23 12/15/2015   Tdap 09/15/2014    Vaccine recommendations: Counseled on the influenza virus vaccine.  Vaccine information sheet given.  Influenza vaccine given after consent obtained.    Screening for cancer: Colon cancer screening: Due repeat next year 2025  Prior or last colon cancer screen: 2015   Prostate Cancer screening: The recommended prostate cancer screening test is a blood test called the prostate-specific antigen (PSA) test. PSA is a protein that is made in the prostate. As you age, your prostate naturally produces more PSA. Abnormally high PSA levels may be caused by: Prostate cancer. An enlarged prostate that is not caused by cancer (benign prostatic hyperplasia, or BPH). This condition is very common in older men. A prostate gland infection (prostatitis) or urinary tract infection. Certain medicines such as male hormones (like testosterone) or other medicines that raise testosterone levels. A rectal exam may be done as part of prostate cancer screening to help provide information about the size of your prostate gland. When a rectal exam is performed, it should be done after the PSA level is drawn to avoid any effect on the results.   Skin cancer screening: Check your skin regularly for new changes, growing lesions, or other lesions of concern Come in for evaluation if you have skin lesions of concern.   Lung cancer  screening: If you have a greater than 20 pack year history of tobacco use, then you may qualify for lung cancer screening with a chest CT scan.   Please call your insurance company to inquire about coverage for this test.   Pancreatic cancer:  no current screening test is available or routinely recommended. (risk factors: smoking, overweight or obese, diabetes, chronic pancreatitis, work exposure - dry cleaning, metal working, 70yo>, M>F, Tree surgeon, family hx/o, hereditary breast, ovarian, melanoma, lynch, peutz-jeghers).  Symptoms: jaundice, dark urine, light color or greasy  stools, itchy skin, belly or back pain, weight loss, poor appetite, nausea, vomiting, liver enlargement, DVT/blood clots.   We currently don't have screenings for other cancers besides breast, cervical, colon, and lung cancers.  If you have a strong family history of cancer or have other cancer screening concerns, please let me know.  Genetic testing referral is an option for individuals with high cancer risk in the family.  There are some other cancer screenings in development currently.   Bone health: Get at least 150 minutes of aerobic exercise weekly Get weight bearing exercise at least once weekly Bone density test:  A bone density test is an imaging test that uses a type of X-ray to measure the amount of calcium and other minerals in your bones. The test may be used to diagnose or screen you for a condition that causes weak or thin bones (osteoporosis), predict your risk for a broken bone (fracture), or determine how well your osteoporosis treatment is working. The bone density test is recommended for females 65 and older, or females or males <65 if certain risk factors such as thyroid disease, long term use of steroids such as for asthma or rheumatological issues, vitamin D deficiency, estrogen deficiency, family history of osteoporosis, self or family history of fragility fracture in first degree  relative.    Heart health: Get at least 150 minutes of aerobic exercise weekly Limit alcohol It is important to maintain a healthy blood pressure and healthy cholesterol numbers  Heart disease screening: Screening for heart disease includes screening for blood pressure, fasting lipids, glucose/diabetes screening, BMI height to weight ratio, reviewed of smoking status, physical activity, and diet.    Goals include blood pressure 120/80 or less, maintaining a healthy lipid/cholesterol profile, preventing diabetes or keeping diabetes numbers under good control, not smoking or using tobacco products, exercising most days per week or at least 150 minutes per week of exercise, and eating healthy variety of fruits and vegetables, healthy oils, and avoiding unhealthy food choices like fried food, fast food, high sugar and high cholesterol foods.    Other tests may possibly include EKG test, CT coronary calcium score, echocardiogram, exercise treadmill stress test.       Vascular disease screening: For higher risk individuals including smokers, diabetics, patients with known heart disease or high blood pressure, kidney disease, and others, screening for vascular disease or atherosclerosis of the arteries is available.  Examples may include carotid ultrasound, abdominal aortic ultrasound, ABI blood flow screening in the legs, thoracic aorta screening.  Normal ABI 01/2021   Medical care options: I recommend you continue to seek care here first for routine care.  We try really hard to have available appointments Monday through Friday daytime hours for sick visits, acute visits, and physicals.  Urgent care should be used for after hours and weekends for significant issues that cannot wait till the next day.  The emergency department should be used for significant potentially life-threatening emergencies.  The emergency department is expensive, can often have long wait times for less significant concerns,  so try to utilize primary care, urgent care, or telemedicine when possible to avoid unnecessary trips to the emergency department.  Virtual visits and telemedicine have been introduced since the pandemic started in 2020, and can be convenient ways to receive medical care.  We offer virtual appointments as well to assist you in a variety of options to seek medical care.   Legal  Take the time to do a last will and testament,  Advanced Directives including Health Care Power of Attorney and Living Will documents.  Don't leave your family with burdens that can be handled ahead of time.   Advanced Directives: I recommend you consider completing a Health Care Power of Attorney and Living Will.   These documents respect your wishes and help alleviate burdens on your loved ones if you were to become terminally ill or be in a position to need those documents enforced.    You can complete Advanced Directives yourself, have them notarized, then have copies made for our office, for you and for anybody you feel should have them in safe keeping.  Or, you can have an attorney prepare these documents.   If you haven't updated your Last Will and Testament in a while, it may be worthwhile having an attorney prepare these documents together and save on some costs.       Spiritual and Emotional Health Keeping a healthy spiritual life can help you better manage your physical health. Your spiritual life can help you to cope with any issues that may arise with your physical health.  Balance can keep [place] healthy and help [place] to recover.  If you are struggling with your spiritual health there are questions that you may want to ask yourself:  What makes me feel most complete? When do I feel most connected to the rest of the world? Where do I find the most inner strength? What am I doing when I feel whole?  Helpful tips: Being in nature. Some people feel very connected and at peace when they are walking outdoors or are  outside. Helping others. Some feel the largest sense of wellbeing when they are of service to others. Being of service can take on many forms. It can be doing volunteer work, being kind to strangers, or offering a hand to a friend in need. Gratitude. Some people find they feel the most connected when they remain grateful. They may make lists of all the things they are grateful for or say a thank you out loud for all they have.    Emotional Health Are you in tune with your emotional health?  Check out this link: http://www.marquez-love.com/    Financial Health Make sure you use a budget for your personal finances Make sure you are insured against risks (health insurance, life insurance, auto insurance, etc) Save more, spend less Set financial goals If you need help in this area, good resources include counseling through [employer] or other community resources, have a meeting with a Social research officer, government, and a good resource is the [employer]    Luke Orr was seen today for annual exam.  Diagnoses and all orders for this visit:  Encounter for health maintenance examination in adult -     Magnesium -     Comprehensive metabolic panel -     CBC -     Lipid panel -     Hemoglobin A1c -     POCT Urinalysis DIP (Proadvantage Device)  Needs flu shot -     Flu Vaccine Trivalent High Dose (Fluad)  Essential hypertension  Atherosclerosis of aorta (HCC)  Benign prostatic hyperplasia with lower urinary tract symptoms, symptom details unspecified  Fatigue, unspecified type  Hyperlipidemia, unspecified hyperlipidemia type -     Lipid panel  Impaired fasting blood sugar -     Hemoglobin A1c  Vaccine counseling  Screening for diabetes mellitus  Prediabetes -     Hemoglobin A1c  Lumbar degenerative disc  disease  Wears dentures  Weakness of both lower extremities -     Magnesium -     Comprehensive metabolic panel -     CBC  Other orders -      Cancel: Pfizer Comirnaty Covid -19 Vaccine 50yrs and older   Follow-up pending labs, yearly for physical

## 2022-11-23 ENCOUNTER — Other Ambulatory Visit: Payer: Self-pay | Admitting: Medical

## 2022-11-23 LAB — LIPID PANEL
Chol/HDL Ratio: 2.5 ratio (ref 0.0–5.0)
Cholesterol, Total: 151 mg/dL (ref 100–199)
HDL: 60 mg/dL (ref >39)
LDL Chol Calc (NIH): 79 mg/dL (ref 0–99)
Triglycerides: 57 mg/dL (ref 0–149)
VLDL Cholesterol Cal: 12 mg/dL (ref 5–40)

## 2022-11-23 LAB — CBC
Hematocrit: 43.2 % (ref 37.5–51.0)
Hemoglobin: 14.2 g/dL (ref 13.0–17.7)
MCH: 29.2 pg (ref 26.6–33.0)
MCHC: 32.9 g/dL (ref 31.5–35.7)
MCV: 89 fL (ref 79–97)
Platelets: 304 10*3/uL (ref 150–450)
RBC: 4.87 x10E6/uL (ref 4.14–5.80)
RDW: 12.9 % (ref 11.6–15.4)
WBC: 6.5 10*3/uL (ref 3.4–10.8)

## 2022-11-23 LAB — COMPREHENSIVE METABOLIC PANEL
ALT: 16 IU/L (ref 0–44)
AST: 21 IU/L (ref 0–40)
Albumin: 4.4 g/dL (ref 3.9–4.9)
Alkaline Phosphatase: 86 IU/L (ref 44–121)
BUN/Creatinine Ratio: 13 (ref 10–24)
BUN: 11 mg/dL (ref 8–27)
Bilirubin Total: 0.7 mg/dL (ref 0.0–1.2)
CO2: 26 mmol/L (ref 20–29)
Calcium: 9.5 mg/dL (ref 8.6–10.2)
Chloride: 102 mmol/L (ref 96–106)
Creatinine, Ser: 0.87 mg/dL (ref 0.76–1.27)
Globulin, Total: 2.3 g/dL (ref 1.5–4.5)
Glucose: 97 mg/dL (ref 70–99)
Potassium: 3.9 mmol/L (ref 3.5–5.2)
Sodium: 143 mmol/L (ref 134–144)
Total Protein: 6.7 g/dL (ref 6.0–8.5)
eGFR: 93 mL/min/{1.73_m2} (ref >59)

## 2022-11-23 LAB — MAGNESIUM: Magnesium: 2.2 mg/dL (ref 1.6–2.3)

## 2022-11-23 LAB — HEMOGLOBIN A1C
Est. average glucose Bld gHb Est-mCnc: 131 mg/dL
Hgb A1c MFr Bld: 6.2 % — ABNORMAL HIGH (ref 4.8–5.6)

## 2022-11-23 MED ORDER — METFORMIN HCL 500 MG PO TABS: ORAL_TABLET | ORAL | 1 refills | Status: DC

## 2022-11-23 NOTE — Progress Notes (Signed)
Results sent through MyChart

## 2023-01-24 ENCOUNTER — Encounter: Payer: Self-pay | Admitting: Cardiology

## 2023-01-24 ENCOUNTER — Ambulatory Visit: Payer: Medicare Other | Attending: Cardiology | Admitting: Cardiology

## 2023-01-24 VITALS — BP 130/82 | HR 75 | Resp 16 | Ht 67.0 in | Wt 222.0 lb

## 2023-01-24 DIAGNOSIS — E782 Mixed hyperlipidemia: Secondary | ICD-10-CM

## 2023-01-24 DIAGNOSIS — I10 Essential (primary) hypertension: Secondary | ICD-10-CM | POA: Diagnosis not present

## 2023-01-24 NOTE — Progress Notes (Signed)
  Cardiology Office Note:  .   Date:  01/24/2023  ID:  Luke Orr, DOB 10/02/1950, MRN [id number] PCP: Genia Del  [facility] Providers Cardiologist:  Truett Mainland, MD PCP: Jac Canavan, PA-C  Chief Complaint  Patient presents with   Palpitations        Follow-up           History of Present Illness: .    Luke Orr is a 72 y.o. male with hypertension, hyperlipidemia, prediabetes, former smoker, family history of coronary artery disease.   Patient is doing well, denies chest pain, shortness of breath, palpitations, leg edema, orthopnea, PND, TIA/syncope.  He has not been as physically active, but [facility]   Vitals:   01/24/23 1157  BP: 130/82  Pulse: 75  Resp: 16  SpO2: 96%     ROS:  Review of Systems  Cardiovascular:  Negative for chest pain, dyspnea on exertion, leg swelling, palpitations and syncope.     Studies Reviewed: Marland Kitchen        EKG 01/24/2023: Normal sinus rhythm Normal ECG No previous ECGs available    Independently interpreted Labs 11/2022: Chol 151, TG 57, HDL 60, LDL 79    Physical Exam:   Physical Exam Vitals and nursing note reviewed.  Constitutional:      General: He is not in acute distress. Neck:     Vascular: No JVD.  Cardiovascular:     Rate and Rhythm: Normal rate and regular rhythm.     Heart sounds: Normal heart sounds. No murmur heard. Pulmonary:     Effort: Pulmonary effort is normal.     Breath sounds: Normal breath sounds. No wheezing or rales.  Musculoskeletal:     Right lower leg: No edema.     Left lower leg: No edema.      VISIT DIAGNOSES:   ICD-10-CM   1. Essential hypertension  I10 EKG 12-Lead    2. Mixed hyperlipidemia  E78.2        ASSESSMENT AND PLAN: .    Luke Orr is a 72 y.o. male with hypertension, hyperlipidemia, prediabetes, former smoker, family history of coronary artery disease.   Palpitations: No  recurrence   Hypertension: Well-controlled   Mixed hyperlipidemia: HDL 60, LDL 79. Reasonable to continue current dose of statin.   No primary titration for aspirin, therefore stopped. Discussed heart healthy diet and lifestyle.     F/u in 1 year  Signed, Elder Negus, MD

## 2023-01-24 NOTE — Patient Instructions (Signed)
Medication Instructions:   Your physician recommends that you continue on your current medications as directed. Please refer to the Current Medication list given to you today.  *If you need a refill on your cardiac medications before your next appointment, please call your pharmacy*    Follow-Up: At [facility], you and your health needs are our priority.  As part of our continuing mission to provide you with exceptional heart care, we have created designated Provider Care Teams.  These Care Teams include your primary Cardiologist (physician) and Advanced Practice Providers (APPs -  Physician Assistants and Nurse Practitioners) who all work together to provide you with the care you need, when you need it.  We recommend signing up for the patient portal called "MyChart".  Sign up information is provided on this After Visit Summary.  MyChart is used to connect with patients for Virtual Visits (Telemedicine).  Patients are able to view lab/test results, encounter notes, upcoming appointments, etc.  Non-urgent messages can be sent to your provider as well.   To learn more about what you can do with MyChart, go to [email or web address].    Your next appointment:   1 year(s)  Provider:   Dr. Rosemary Holms

## 2023-05-06 ENCOUNTER — Ambulatory Visit: Payer: Self-pay | Admitting: Medical

## 2023-05-06 NOTE — Telephone Encounter (Signed)
 Copied from CRM [id number]. Topic: Clinical - Red Word Triage >> May 06, 2023 12:06 PM Carla L wrote: Red Word that prompted transfer to Nurse Triage: Pain under ribcage, wife states it was pretty bad this weekend. Having gas issues for the past 2-3 weeks. Reason for Disposition . [1] MODERATE pain (e.g., interferes with normal activities) AND [2] pain comes and goes (cramps) AND [3] present > 24 hours  (Exception: Pain with Vomiting or Diarrhea - see that Guideline.)  Answer Assessment - Initial Assessment Questions 1. LOCATION: "Where does it hurt?"      Under the right rib cage 2. RADIATION: "Does the pain shoot anywhere else?" (e.g., chest, back)     No 3. ONSET: "When did the pain begin?" (Minutes, hours or days ago)      Friday 4. PATTERN "Does the pain come and go, or is it constant?"    - If it comes and goes: "How long does it last?" "Do you have pain now?"     (Note: Comes and goes means the pain is intermittent. It goes away completely between bouts.)    - If constant: "Is it getting better, staying the same, or getting worse?"      (Note: Constant means the pain never goes away completely; most serious pain is constant and gets worse.)      Comes and goes 5. SEVERITY: "How bad is the pain?"  (e.g., Scale 1-10; mild, moderate, or severe)    - MILD (1-3): Doesn't interfere with normal activities, abdomen soft and not tender to touch.     - MODERATE (4-7): Interferes with normal activities or awakens from sleep, abdomen tender to touch.     - SEVERE (8-10): Excruciating pain, doubled over, unable to do any normal activities.       7 6. RECURRENT SYMPTOM: "Have you ever had this type of stomach pain before?" If Yes, ask: "When was the last time?" and "What happened that time?"      Not sure about this particular pain 7. CAUSE: "What do you think is causing the stomach pain?"     Not sure 8. RELIEVING/AGGRAVATING FACTORS: "What makes it better or worse?" (e.g., antacids, bending or  twisting motion, bowel movement)     Rolaids last night for the gas pains, feel the pain with twisting/moving 9. OTHER SYMPTOMS: "Do you have any other symptoms?" (e.g., back pain, diarrhea, fever, urination pain, vomiting)       No  Protocols used: Abdominal Pain - Male-A-AH

## 2023-05-06 NOTE — Telephone Encounter (Signed)
 Chief Complaint: Abdominal pain Symptoms: gas pain upper right abdomen under rib cage 7/10 Frequency: onset last Friday Pertinent Negatives: Patient denies other symptoms Disposition: [] ED /[] Urgent Care (no appt availability in office) / [x] Appointment(In office/virtual)/ []  [facility]/ [] Home Care/ [] Refused Recommended Disposition /[]  Mobile Bus/ []  Follow-up with PCP Additional Notes: [name] called to report patient's pain to the upper right abdomen under rib cage. She says he's been dealing with gas pains for a while, but this pain under the rib cage is new since Friday, when he twists or turns he can feel it more. He was up last night taking rolaids.She says he only wants to see Dr. Aleen Campi. Advised first available is on Wednesday, she agrees to that appointment.

## 2023-05-08 ENCOUNTER — Ambulatory Visit (INDEPENDENT_AMBULATORY_CARE_PROVIDER_SITE_OTHER): Admitting: Medical

## 2023-05-08 ENCOUNTER — Encounter: Payer: Self-pay | Admitting: Medical

## 2023-05-08 VITALS — BP 132/82 | HR 72 | Wt 223.2 lb

## 2023-05-08 DIAGNOSIS — Z1211 Encounter for screening for malignant neoplasm of colon: Secondary | ICD-10-CM

## 2023-05-08 DIAGNOSIS — R1011 Right upper quadrant pain: Secondary | ICD-10-CM

## 2023-05-08 NOTE — Patient Instructions (Addendum)
 Recommendations: Continue routine medicines as usual Consider beginning Gas-X or simethicone over-the-counter for gas or bloating as needed once to twice a day for the next few days For the next week try to eat smaller portions, cut out alcohol and beer altogether for a week to see if symptoms resolve Avoid foods that aggravate acid reflux or gas, so avoid onions, deep-fried foods, heavy dense meals such as meat and potatoes If desired you can use Tylenol for pain or we could do some prescription strength pain medicine short-term to help relieve pain.  You can use some over the counter ibuprofen if needed. If the symptoms persist we can order as a next step and ultrasound of the abdomen Expect phone call about colonoscopy schedule

## 2023-05-08 NOTE — Progress Notes (Signed)
 Subjective:  Luke Orr is a 73 y.o. male who presents for Chief Complaint  Patient presents with   other    Abdominal pain, rt. Side abdominal pain, started a couple of weeks ago, no urinary issues, and no bowel issues,      Here for a pain in the right side.  Been going a couple weeks.  Pain is fairly regular every day.  He has been gassy.  No injury or fall or trauma.  No discoloration rash or bruising.  Sinus it is worse if he lays on his belly.  Worse with taking deep breath.  Eating does not make it worse and sometimes helps.  He denies any urinary issues, no constipation or diarrhea.  No blood in the urine.  No history of kidney stone  Really nothing for symptoms  He does drink a beer every other night  Also needs to update colonoscopy  No other aggravating or relieving factors.    No other c/o.  Past Medical History:  Diagnosis Date   Abnormal abdominal ultrasound 03/2018   no AAA, atherosclerosis present   Arthritis    right knee   BPH (benign prostatic hypertrophy)    with urinary urgency and weak stream, followed by Dr. Bjorn Orr, [facility]   Bulging lumbar disc    self reported   DDD (degenerative disc disease), lumbar    Elevated PSA    Dr. Annabell Orr, [facility]   Full dentures    H/O echocardiogram 03/2018   [facility], mod concetric LVH, EF 58%. mild left atrial enlargement   Hyperlipidemia    Hypertension    Impaired fasting blood sugar    Obesity    Obesity    Current Outpatient Medications on File Prior to Visit  Medication Sig Dispense Refill   amLODipine (NORVASC) 10 MG tablet Take 1 tablet (10 mg total) by mouth daily. 90 tablet 3   ASPIRIN LOW DOSE 81 MG tablet TAKE 1 TABLET BY MOUTH EVERY DAY 90 tablet 0   cholecalciferol (VITAMIN D3) 25 MCG (1000 UNIT) tablet Take 1,000 Units by mouth daily.     lisinopril-hydrochlorothiazide (ZESTORETIC) 20-12.5 MG tablet Take 1 tablet by mouth daily. 90 tablet 3   metFORMIN  (GLUCOPHAGE) 500 MG tablet TAKE 1 TABLET BY MOUTH EVERY DAY WITH BREAKFAST 90 tablet 1   rosuvastatin (CRESTOR) 20 MG tablet Take 1 tablet (20 mg total) by mouth daily. 90 tablet 3   vitamin E 1000 UNIT capsule Take 1,000 Units by mouth daily.     cetirizine (ZYRTEC) 10 MG tablet Take 1 tablet (10 mg total) by mouth at bedtime. (Patient not taking: Reported on 05/08/2023) 30 tablet 11   No current facility-administered medications on file prior to visit.     The following portions of the patient's history were reviewed and updated as appropriate: allergies, current medications, past family history, past medical history, past social history, past surgical history and problem list.  ROS Otherwise as in subjective above     Objective: BP 132/82   Pulse 72   Wt 223 lb 3.2 oz (101.2 kg)   BMI 34.96 kg/m   Wt Readings from Last 3 Encounters:  05/08/23 223 lb 3.2 oz (101.2 kg)  01/24/23 222 lb (100.7 kg)  11/22/22 218 lb 3.2 oz (99 kg)    General appearance: alert, no distress, well developed, well nourished Heart: RRR, normal S1, S2, no murmurs Lungs: CTA bilaterally, no wheezes, rhonchi, or rales Abdomen: +bs, soft,  non  distended, no masses, no hepatomegaly, no splenomegaly Mildly tender over the right upper abdomen with deep breaths but otherwise chest wall and abdomen nontender Pulses: 2+ radial pulses, 2+ pedal pulses, normal cap refill Ext: no edema   Assessment: Encounter Diagnoses  Name Primary?   Right upper quadrant abdominal pain Yes   Screening for colon cancer      Plan: He is only mildly tender on exam in the right upper quadrant with deep breaths.  We discussed possible differential diagnosis which would include gallstones, gallbladder issue, liver inflammation, pleurisy, chest wall pain, indigestion, mass,, or other  Recommendations: Continue routine medicines as usual Consider beginning Gas-X or simethicone over-the-counter for gas or bloating as needed  once to twice a day for the next few days For the next week try to eat smaller portions, cut out alcohol and beer altogether for a week to see if symptoms resolve Avoid foods that aggravate acid reflux or gas, so avoid onions, deep-fried foods, heavy dense meals such as meat and potatoes If desired you can use Tylenol for pain or we could do some prescription strength pain medicine short-term to help relieve pain If the symptoms persist we can order as a next step and ultrasound of the abdomen Expect phone call about colonoscopy schedule  Luke Orr was seen today for other.  Diagnoses and all orders for this visit:  Right upper quadrant abdominal pain -     Ambulatory referral to Gastroenterology  Screening for colon cancer -     Ambulatory referral to Gastroenterology   Follow up: call report 2 weeks

## 2023-05-28 ENCOUNTER — Other Ambulatory Visit: Payer: Self-pay | Admitting: Medical

## 2023-05-30 ENCOUNTER — Encounter: Payer: Self-pay | Admitting: Physician Assistant

## 2023-06-03 DIAGNOSIS — R3915 Urgency of urination: Secondary | ICD-10-CM | POA: Diagnosis not present

## 2023-06-05 ENCOUNTER — Encounter: Payer: Self-pay | Admitting: Urology

## 2023-06-05 ENCOUNTER — Other Ambulatory Visit: Payer: Self-pay | Admitting: Urology

## 2023-06-05 DIAGNOSIS — R972 Elevated prostate specific antigen [PSA]: Secondary | ICD-10-CM

## 2023-06-07 ENCOUNTER — Other Ambulatory Visit: Payer: Self-pay | Admitting: Medical

## 2023-07-04 ENCOUNTER — Other Ambulatory Visit: Payer: Self-pay | Admitting: Medical

## 2023-07-04 DIAGNOSIS — I10 Essential (primary) hypertension: Secondary | ICD-10-CM

## 2023-07-12 ENCOUNTER — Ambulatory Visit
Admission: RE | Admit: 2023-07-12 | Discharge: 2023-07-12 | Disposition: A | Discharge status: Home / Self Care | Source: Ambulatory Visit | Attending: Urology | Admitting: Urology

## 2023-07-12 DIAGNOSIS — N3289 Other specified disorders of bladder: Secondary | ICD-10-CM | POA: Diagnosis not present

## 2023-07-12 DIAGNOSIS — I700 Atherosclerosis of aorta: Secondary | ICD-10-CM | POA: Diagnosis not present

## 2023-07-12 DIAGNOSIS — R972 Elevated prostate specific antigen [PSA]: Secondary | ICD-10-CM

## 2023-07-12 MED ORDER — GADOPICLENOL 0.5 MMOL/ML IV SOLN
10.0000 mL | Freq: Once | INTRAVENOUS | Status: AC | PRN
  Administered 2023-07-12: 10 mL via INTRAVENOUS

## 2023-07-14 ENCOUNTER — Other Ambulatory Visit: Payer: Self-pay | Admitting: Medical

## 2023-07-14 DIAGNOSIS — I10 Essential (primary) hypertension: Secondary | ICD-10-CM

## 2023-07-15 ENCOUNTER — Telehealth: Payer: Self-pay | Admitting: Medical

## 2023-07-15 NOTE — Telephone Encounter (Signed)
 duplicate

## 2023-07-15 NOTE — Telephone Encounter (Signed)
 Copied from CRM [id number]. Topic: Clinical - Medication Refill >> Jul 15, 2023  9:08 AM Hobson Luna F wrote: Medication: lisinopril -hydrochlorothiazide  (ZESTORETIC ) 20-12.5 MG tablet [[id number]]  Has the patient contacted their pharmacy? Yes  (Agent: If yes, when and what did the pharmacy advise?) Contact office for refill   This is the patient's preferred pharmacy:  [facility] [phone] Jonette Nestle, [place] - [address] [place] [address] Phone: [phone] Fax: [phone]  Is this the correct pharmacy for this prescription? Yes If no, delete pharmacy and type the correct one.   Has the prescription been filled recently? Yes  Is the patient out of the medication? Yes  Has the patient been seen for an appointment in the last year OR does the patient have an upcoming appointment? Yes  Can we respond through MyChart? Yes   Patient is requesting a call when the medication is sent.   Agent: Please be advised that Rx refills may take up to 3 business days. We ask that you follow-up with your pharmacy.

## 2023-08-01 ENCOUNTER — Ambulatory Visit: Admitting: Physician Assistant

## 2023-08-28 DIAGNOSIS — R3915 Urgency of urination: Secondary | ICD-10-CM | POA: Diagnosis not present

## 2023-08-28 DIAGNOSIS — R3912 Poor urinary stream: Secondary | ICD-10-CM | POA: Diagnosis not present

## 2023-09-12 ENCOUNTER — Other Ambulatory Visit (HOSPITAL_COMMUNITY): Payer: Self-pay | Admitting: Urology

## 2023-09-12 DIAGNOSIS — R972 Elevated prostate specific antigen [PSA]: Secondary | ICD-10-CM

## 2023-09-26 ENCOUNTER — Encounter (HOSPITAL_COMMUNITY): Payer: Self-pay

## 2023-09-26 ENCOUNTER — Encounter (HOSPITAL_COMMUNITY)
Admission: RE | Admit: 2023-09-26 | Discharge: 2023-09-26 | Disposition: A | Discharge status: Home / Self Care | Source: Ambulatory Visit | Attending: Urology | Admitting: Urology

## 2023-09-26 DIAGNOSIS — R972 Elevated prostate specific antigen [PSA]: Secondary | ICD-10-CM | POA: Insufficient documentation

## 2023-09-26 MED ORDER — FLOTUFOLASTAT F 18 GALLIUM 296-5846 MBQ/ML IV SOLN
8.6300 | Freq: Once | INTRAVENOUS | Status: AC
  Administered 2023-09-26: 8.63 via INTRAVENOUS

## 2023-09-27 ENCOUNTER — Emergency Department (HOSPITAL_COMMUNITY)
Admission: EM | Admit: 2023-09-27 | Discharge: 2023-09-27 | Disposition: A | Discharge status: Home / Self Care | Attending: Emergency Medicine | Admitting: Emergency Medicine

## 2023-09-27 ENCOUNTER — Emergency Department (HOSPITAL_COMMUNITY)

## 2023-09-27 ENCOUNTER — Encounter (HOSPITAL_COMMUNITY): Payer: Self-pay

## 2023-09-27 ENCOUNTER — Ambulatory Visit: Payer: Self-pay

## 2023-09-27 ENCOUNTER — Other Ambulatory Visit: Payer: Self-pay

## 2023-09-27 DIAGNOSIS — Z79899 Other long term (current) drug therapy: Secondary | ICD-10-CM | POA: Diagnosis not present

## 2023-09-27 DIAGNOSIS — R42 Dizziness and giddiness: Secondary | ICD-10-CM | POA: Diagnosis not present

## 2023-09-27 DIAGNOSIS — Z7982 Long term (current) use of aspirin: Secondary | ICD-10-CM | POA: Diagnosis not present

## 2023-09-27 DIAGNOSIS — I10 Essential (primary) hypertension: Secondary | ICD-10-CM | POA: Insufficient documentation

## 2023-09-27 DIAGNOSIS — R200 Anesthesia of skin: Secondary | ICD-10-CM | POA: Diagnosis not present

## 2023-09-27 DIAGNOSIS — I6782 Cerebral ischemia: Secondary | ICD-10-CM | POA: Diagnosis not present

## 2023-09-27 LAB — CBC WITH DIFFERENTIAL/PLATELET
Abs Immature Granulocytes: 0.02 K/uL (ref 0.00–0.07)
Basophils Absolute: 0.1 K/uL (ref 0.0–0.1)
Basophils Relative: 1 %
Eosinophils Absolute: 1.4 K/uL — ABNORMAL HIGH (ref 0.0–0.5)
Eosinophils Relative: 19 %
HCT: 42.8 % (ref 39.0–52.0)
Hemoglobin: 13.8 g/dL (ref 13.0–17.0)
Immature Granulocytes: 0 %
Lymphocytes Relative: 25 %
Lymphs Abs: 1.8 K/uL (ref 0.7–4.0)
MCH: 28.2 pg (ref 26.0–34.0)
MCHC: 32.2 g/dL (ref 30.0–36.0)
MCV: 87.5 fL (ref 80.0–100.0)
Monocytes Absolute: 0.6 K/uL (ref 0.1–1.0)
Monocytes Relative: 8 %
Neutro Abs: 3.5 K/uL (ref 1.7–7.7)
Neutrophils Relative %: 47 %
Platelets: 290 K/uL (ref 150–400)
RBC: 4.89 MIL/uL (ref 4.22–5.81)
RDW: 13.8 % (ref 11.5–15.5)
WBC: 7.4 K/uL (ref 4.0–10.5)
nRBC: 0 % (ref 0.0–0.2)

## 2023-09-27 LAB — URINALYSIS, ROUTINE W REFLEX MICROSCOPIC
Bilirubin Urine: NEGATIVE
Glucose, UA: NEGATIVE mg/dL
Hgb urine dipstick: NEGATIVE
Ketones, ur: NEGATIVE mg/dL
Leukocytes,Ua: NEGATIVE
Nitrite: NEGATIVE
Protein, ur: NEGATIVE mg/dL
Specific Gravity, Urine: 1.005 (ref 1.005–1.030)
pH: 7 (ref 5.0–8.0)

## 2023-09-27 LAB — BASIC METABOLIC PANEL WITH GFR
Anion gap: 8 (ref 5–15)
BUN: 10 mg/dL (ref 8–23)
CO2: 28 mmol/L (ref 22–32)
Calcium: 9.7 mg/dL (ref 8.9–10.3)
Chloride: 103 mmol/L (ref 98–111)
Creatinine, Ser: 0.78 mg/dL (ref 0.61–1.24)
GFR, Estimated: >60 mL/min (ref >60)
Glucose, Bld: 97 mg/dL (ref 70–99)
Potassium: 3.6 mmol/L (ref 3.5–5.1)
Sodium: 139 mmol/L (ref 135–145)

## 2023-09-27 LAB — CBG MONITORING, ED: Glucose-Capillary: 96 mg/dL (ref 70–99)

## 2023-09-27 MED ORDER — LISINOPRIL 10 MG PO TABS
20.0000 mg | ORAL_TABLET | Freq: Once | ORAL | Status: AC
  Administered 2023-09-27: 20 mg via ORAL
  Filled 2023-09-27: qty 2

## 2023-09-27 MED ORDER — HYDROCHLOROTHIAZIDE 12.5 MG PO TABS
12.5000 mg | ORAL_TABLET | Freq: Once | ORAL | Status: AC
  Administered 2023-09-27: 12.5 mg via ORAL
  Filled 2023-09-27: qty 1

## 2023-09-27 NOTE — Discharge Instructions (Signed)
Drink plenty of fluids.  Follow-up with your family doctor next week °

## 2023-09-27 NOTE — ED Provider Triage Note (Signed)
 Emergency Medicine Provider Triage Evaluation Note  Luke Orr , a 73 y.o. male  was evaluated in triage.  Pt complains of dizziness x 1wk, R hand finger tip numbness x 1 mo.  Review of Systems  Positive: Dizziness, numbness Negative: CP, SHOB, vision changes, weakness, fever, chills  Physical Exam  BP (!) 185/95 (BP Location: Left Arm)   Pulse 68   Temp 98 F (36.7 C) (Oral)   Resp 17   SpO2 97%  Gen:   Awake, no distress   Resp:  Normal effort  MSK:   Moves extremities without difficulty  Other:  Equal bilateral grip strength, equal sensation in BUE  Medical Decision Making  Medically screening exam initiated at 11:05 AM.  Appropriate orders placed.  Luke Orr was informed that the remainder of the evaluation will be completed by another provider, this initial triage assessment does not replace that evaluation, and the importance of remaining in the ED until their evaluation is complete.  Labs and imaging ordered   Luke Orr 09/27/23 [id number]

## 2023-09-27 NOTE — ED Triage Notes (Signed)
 POV/ by self/ c/o dizziness x1 week/ numbness of right fingers x1 month/ pt is A&OX4/ ambulatory

## 2023-09-27 NOTE — Telephone Encounter (Signed)
 FYI Only or Action Required?: FYI only for provider.  Patient was last seen in primary care on 05/08/2023 by Bulah Alm RAMAN, PA-C.  Called Nurse Triage reporting Dizziness.  Symptoms began dizzy x 1 week, hand numbness x 1 month.  Interventions attempted: Rest, hydration, or home remedies.  Symptoms are: dizziness/woozy feeling, headache, right sided chest pain, blurry vision, right hand numbness, hot sweats/feels feverish, occasional coughstable; intermittent.  Triage Disposition: Go to ED Now (or PCP Triage)  Patient/caregiver understands and will follow disposition?: Yes                          Copied from CRM #[id number]. Topic: Clinical - Red Word Triage >> Sep 27, 2023  8:18 AM Myrick T wrote: Red Word that prompted transfer to Nurse Triage: Wife Luke Orr called stated patient has right side pain, dizzy and faint feeling. He has he feeling like he is running a fever but doesn't think he has one. Reason for Disposition  [1] Chest pain lasts > 5 minutes AND [2] occurred in past 3 days (72 hours) (Exception: Feels exactly the same as previously diagnosed heartburn and has accompanying sour taste in mouth.)  Answer Assessment - Initial Assessment Questions Patient states he still feels woozy but denies any chest pain at this time. Patient has difficult time describing his symptoms in detail.   1. DESCRIPTION: Describe your dizziness.     Just dizzy the only thing I can tell you, woozy like  2. LIGHTHEADED: Do you feel lightheaded? (e.g., somewhat faint, woozy, weak upon standing)     Yes.  3. VERTIGO: Do you feel like either you or the room is spinning or tilting? (i.e., vertigo)     No.  4. SEVERITY: How bad is it?  Do you feel like you are going to faint? Can you stand and walk?     Patient states he can stand and walk but feels a little wobbly. Patient denies fainting or falling.  5. ONSET:  When did the dizziness begin?     Last  week, comes and goes. He states it lasted 2 days.  6. AGGRAVATING FACTORS: Does anything make it worse? 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  7. HEART RATE: Can you tell me your heart rate? How many beats in 15 seconds?  (Note: Not all patients can do this.)       No. Patient states he checked his BP the other day during a dizzy spell and  it wasn't high.  8. CAUSE: What do you think is causing the dizziness? (e.g., decreased fluids or food, diarrhea, emotional distress, heat exposure, new medicine, sudden standing, vomiting; unknown)     He states he has no idea.  9. RECURRENT SYMPTOM: Have you had dizziness before? If Yes, ask: When was the last time? What happened that time?     Yeah, unsure how long ago since last episode.  10. OTHER SYMPTOMS: Do you have any other symptoms? (e.g., fever, chest pain, vomiting, diarrhea, bleeding)       Mild headache intermittent, right sided chest pain intermittent (unable to state how long in duration), right hand numbness (x 1 month), blurry vision intermittent, hot flash/felt feverish, coughs if taking a deep breath. Patient denies SOB, changes in speech.   11. PREGNANCY: Is there any chance you are pregnant? When was your last menstrual  period?       N/A.  Protocols used: Dizziness - Lightheadedness-A-AH, Chest Pain-A-AH

## 2023-09-29 NOTE — ED Provider Notes (Signed)
 [place] EMERGENCY DEPARTMENT AT [facility] Provider Note   CSN: [id number] Arrival date & time: 09/27/23  1042     Patient presents with: Dizziness and Numbness   Luke Orr is a 73 y.o. male.   Patient complains of some dizziness for a week  The history is provided by the patient and medical records. No language interpreter was used.  Dizziness Quality:  Lightheadedness Severity:  Mild Onset quality:  Sudden Timing:  Intermittent Progression:  Waxing and waning Chronicity:  New Context: not when bending over   Relieved by:  Nothing Associated symptoms: no chest pain, no diarrhea and no headaches        Prior to Admission medications   Medication Sig Start Date End Date Taking? Authorizing Provider  amLODipine  (NORVASC ) 10 MG tablet TAKE 1 TABLET BY MOUTH EVERY DAY 07/15/23   Tysinger, Alm RAMAN, PA-C  ASPIRIN  LOW DOSE 81 MG tablet TAKE 1 TABLET BY MOUTH EVERY DAY 11/06/22   Tysinger, Alm RAMAN, PA-C  cetirizine  (ZYRTEC ) 10 MG tablet Take 1 tablet (10 mg total) by mouth at bedtime. Patient not taking: Reported on 05/08/2023 11/21/21   Tysinger, Alm RAMAN, PA-C  cholecalciferol (VITAMIN D3) 25 MCG (1000 UNIT) tablet Take 1,000 Units by mouth daily.    [provider]  lisinopril -hydrochlorothiazide  (ZESTORETIC ) 20-12.5 MG tablet TAKE 1 TABLET BY MOUTH EVERY DAY 07/04/23   Tysinger, Alm RAMAN, PA-C  metFORMIN  (GLUCOPHAGE ) 500 MG tablet TAKE 1 TABLET BY MOUTH EVERY DAY WITH BREAKFAST 06/07/23   Tysinger, Alm RAMAN, PA-C  rosuvastatin  (CRESTOR ) 20 MG tablet TAKE 1 TABLET BY MOUTH EVERY DAY 05/28/23   Tysinger, Alm RAMAN, PA-C  vitamin E 1000 UNIT capsule Take 1,000 Units by mouth daily.    [provider]    Allergies: Morphine and codeine    Review of Systems  Constitutional:  Negative for appetite change and fatigue.  HENT:  Negative for congestion, ear discharge and sinus pressure.   Eyes:  Negative for discharge.  Respiratory:  Negative for cough.    Cardiovascular:  Negative for chest pain.  Gastrointestinal:  Negative for abdominal pain and diarrhea.  Genitourinary:  Negative for frequency and hematuria.  Musculoskeletal:  Negative for back pain.  Skin:  Negative for rash.  Neurological:  Positive for dizziness. Negative for seizures and headaches.  Psychiatric/Behavioral:  Negative for hallucinations.     Updated Vital Signs BP (!) 181/105   Pulse (!) 55   Temp 98 F (36.7 C) (Oral)   Resp 16   Wt 98 kg   SpO2 97%   BMI 33.83 kg/m   Physical Exam Vitals and nursing note reviewed.  Constitutional:      Appearance: He is well-developed.  HENT:     Head: Normocephalic.     Nose: Nose normal.  Eyes:     General: No scleral icterus.    Conjunctiva/sclera: Conjunctivae normal.  Neck:     Thyroid: No thyromegaly.  Cardiovascular:     Rate and Rhythm: Normal rate and regular rhythm.     Heart sounds: No murmur heard.    No friction rub. No gallop.  Pulmonary:     Breath sounds: No stridor. No wheezing or rales.  Chest:     Chest wall: No tenderness.  Abdominal:     General: There is no distension.     Tenderness: There is no abdominal tenderness. There is no rebound.  Musculoskeletal:        General: Normal range of  motion.     Cervical back: Neck supple.  Lymphadenopathy:     Cervical: No cervical adenopathy.  Skin:    Findings: No erythema or rash.  Neurological:     Mental Status: He is alert and oriented to person, place, and time.     Motor: No abnormal muscle tone.     Coordination: Coordination normal.  Psychiatric:        Behavior: Behavior normal.     (all labs ordered are listed, but only abnormal results are displayed) Labs Reviewed  CBC WITH DIFFERENTIAL/PLATELET - Abnormal; Notable for the following components:      Result Value   Eosinophils Absolute 1.4 (*)    All other components within normal limits  BASIC METABOLIC PANEL WITH GFR  URINALYSIS, ROUTINE W REFLEX MICROSCOPIC  CBG  MONITORING, ED    EKG: EKG Interpretation Date/Time:  Friday September 27 2023 10:59:36 EDT Ventricular Rate:  68 PR Interval:  152 QRS Duration:  98 QT Interval:  398 QTC Calculation: 423 R Axis:   55  Text Interpretation: Normal sinus rhythm Normal ECG Confirmed by Jerrol Agent ([id number]) on 09/28/2023 9:49:45 PM  Radiology: No results found.   Procedures   Medications Ordered in the ED  lisinopril  (ZESTRIL ) tablet 20 mg (20 mg Oral Given 09/27/23 1404)  hydrochlorothiazide  (HYDRODIURIL ) tablet 12.5 mg (12.5 mg Oral Given 09/27/23 1404)                                    Medical Decision Making Risk Prescription drug management.  Patient with some dizziness and poorly controlled blood pressure.  He was given his home blood pressure medicines and hydrated with fluids and will follow-up with his PCP.  Symptoms improved     Final diagnoses:  Dizziness    ED Discharge Orders     None          Suzette Pac, MD 09/29/23 1215

## 2023-10-02 ENCOUNTER — Ambulatory Visit (INDEPENDENT_AMBULATORY_CARE_PROVIDER_SITE_OTHER): Admitting: Medical

## 2023-10-02 VITALS — BP 160/82 | HR 63 | Wt 213.2 lb

## 2023-10-02 DIAGNOSIS — R42 Dizziness and giddiness: Secondary | ICD-10-CM | POA: Diagnosis not present

## 2023-10-02 DIAGNOSIS — R634 Abnormal weight loss: Secondary | ICD-10-CM

## 2023-10-02 DIAGNOSIS — Z1211 Encounter for screening for malignant neoplasm of colon: Secondary | ICD-10-CM | POA: Diagnosis not present

## 2023-10-02 DIAGNOSIS — R5383 Other fatigue: Secondary | ICD-10-CM | POA: Diagnosis not present

## 2023-10-02 DIAGNOSIS — I10 Essential (primary) hypertension: Secondary | ICD-10-CM

## 2023-10-02 DIAGNOSIS — R400 Somnolence: Secondary | ICD-10-CM | POA: Diagnosis not present

## 2023-10-02 DIAGNOSIS — R972 Elevated prostate specific antigen [PSA]: Secondary | ICD-10-CM

## 2023-10-02 MED ORDER — LISINOPRIL-HYDROCHLOROTHIAZIDE 20-12.5 MG PO TABS: 1.0000 | ORAL_TABLET | Freq: Every day | ORAL | 1 refills | Status: DC

## 2023-10-02 MED ORDER — AMLODIPINE BESYLATE 10 MG PO TABS
10.0000 mg | ORAL_TABLET | Freq: Every day | ORAL | 1 refills | Status: AC
Start: 2023-10-02 — End: ?

## 2023-10-02 NOTE — Progress Notes (Signed)
 Faxed over order for [facility]

## 2023-10-02 NOTE — Progress Notes (Signed)
 Subjective:  Luke Orr is a 73 y.o. male who presents for Chief Complaint  Patient presents with   Hospitalization Follow-up    Hospital follow-up on dizziness and numbness. Still having symptoms. Was told it was based on his blood pressure not controlled but his bp at home is good. This morning bp was 140/80     Here for emergency department follow-up.  He was seen in the emergency department 5 days ago for dizziness.  His blood pressure was high.  He had labs, head scan.  He was told to follow-up with PCP.  He does feel some improvement.  He notes compliance with lisinopril  HCT 20/12.5 mg but says he has not been taking the amlodipine  for several months.  Several months ago he felt dizzy and his pressure was too low so he quit taking the amlodipine .  He has not been checking his blood pressure all that regularly so he cannot really tell me his readings in the last few months.  He has been fatigued.  He was drinking energy drinks fairly regularly but cut that out recently.  He does endorse daytime somnolence.  No significant snoring.  No prior sleep study.  He has been getting some headaches of late.  He denies excess salt in the diet.  He does drink beer occasionally.  Today he went to the emergency department he was working on a car motor and felt dizzy.  He reports compliance with Crestor  cholesterol medicine and metformin   no other aggravating or relieving factors.    No other c/o.  Past Medical History:  Diagnosis Date   Abnormal abdominal ultrasound 03/2018   no AAA, atherosclerosis present   Arthritis    right knee   BPH (benign prostatic hypertrophy)    with urinary urgency and weak stream, followed by Dr. Norleen Seltzer, [facility]   Bulging lumbar disc    self reported   DDD (degenerative disc disease), lumbar    Elevated PSA    Dr. Seltzer, [facility]   Full dentures    H/O echocardiogram 03/2018   [facility], mod concetric LVH, EF 58%. mild  left atrial enlargement   Hyperlipidemia    Hypertension    Impaired fasting blood sugar    Obesity    Obesity    Current Outpatient Medications on File Prior to Visit  Medication Sig Dispense Refill   ASPIRIN  LOW DOSE 81 MG tablet TAKE 1 TABLET BY MOUTH EVERY DAY 90 tablet 0   cetirizine  (ZYRTEC ) 10 MG tablet Take 1 tablet (10 mg total) by mouth at bedtime. 30 tablet 11   cholecalciferol (VITAMIN D3) 25 MCG (1000 UNIT) tablet Take 1,000 Units by mouth daily.     metFORMIN  (GLUCOPHAGE ) 500 MG tablet TAKE 1 TABLET BY MOUTH EVERY DAY WITH BREAKFAST 90 tablet 1   rosuvastatin  (CRESTOR ) 20 MG tablet TAKE 1 TABLET BY MOUTH EVERY DAY 90 tablet 1   vitamin E 1000 UNIT capsule Take 1,000 Units by mouth daily.     No current facility-administered medications on file prior to visit.     The following portions of the patient's history were reviewed and updated as appropriate: allergies, current medications, past family history, past medical history, past social history, past surgical history and problem list.  ROS Otherwise as in subjective above  Objective: BP (!) 160/82   Pulse 63   Wt 213 lb 3.2 oz (96.7 kg)   SpO2 98%   BMI 33.39 kg/m   BP Readings from  Last 3 Encounters:  10/02/23 (!) 160/82  09/27/23 (!) 181/105  05/08/23 132/82   Wt Readings from Last 3 Encounters:  10/02/23 213 lb 3.2 oz (96.7 kg)  09/27/23 216 lb (98 kg)  05/08/23 223 lb 3.2 oz (101.2 kg)    General appearance: alert, no distress, well developed, well nourished HEENT: normocephalic, sclerae anicteric, conjunctiva pink and moist, TMs pearly, nares patent, no discharge or erythema, pharynx normal Oral cavity: MMM, no lesions Neck: supple, no lymphadenopathy, no thyromegaly, no masses Heart: RRR, normal S1, S2, no murmurs Lungs: CTA bilaterally, no wheezes, rhonchi, or rales Abdomen: +bs, soft, mild left lower quadrant tenderness, non tender, non distended, no masses, no hepatomegaly, no  splenomegaly Pulses: 2+ radial pulses, 2+ pedal pulses, normal cap refill Ext: no edema    Assessment: Encounter Diagnoses  Name Primary?   Essential hypertension Yes   Fatigue, unspecified type    Daytime somnolence    Dizziness    Weight loss      Plan: Hypertension-continue lisinopril  HCT and get back on the amlodipine  right away.  He has not been taking amlodipine  for months.  We also discussed that he needs to be checking his blood pressure at home more regularly so he will = know where his blood pressures are running.  Also advised to quit drinking red bull energy drinks, limit salt, limit alcohol.  Hydrate well throughout the day particularly in the hot summer months.  Fatigue-referral to [facility] for home sleep study.  Given his uncontrolled blood pressure in recent months given symptoms including headaches recently, fatigue and daytime somnolence, he very likely could have some sleep apnea  Dizziness-somewhat improved.  I reviewed his recent emergency department visit notes, labs and scans of the head.  His blood pressure was found to be uncontrolled.  No obvious symptoms or signs of vertigo.  We discussed as above to increase hydration water, get back on blood pressure medicine regimen that he was on earlier in the year.  He has lost about 10 pounds since spring.  He thinks this could be less calorie intake over the recent months.  We discussed if he continues to lose weight without trying or has new symptoms of concern particularly lack of appetite, chills, fever, or other new symptoms to recheck sooner  His PSA is elevated and he has been seeing urology.  He just recently had a PET scan.  He is waiting on results from that.  Last colonoscopy 2015.  I advised updated referral for colonoscopy.  He will consider.  Aldan was seen today for hospitalization follow-up.  Diagnoses and all orders for this visit:  Essential hypertension -     amLODipine  (NORVASC ) 10 MG  tablet; Take 1 tablet (10 mg total) by mouth daily. -     lisinopril -hydrochlorothiazide  (ZESTORETIC ) 20-12.5 MG tablet; Take 1 tablet by mouth daily. -     TSH  Fatigue, unspecified type -     TSH  Daytime somnolence  Dizziness  Weight loss    Follow up: pending lab, sleep study

## 2023-10-03 ENCOUNTER — Ambulatory Visit: Payer: Self-pay | Admitting: Medical

## 2023-10-03 LAB — TSH: TSH: 1.55 u[IU]/mL (ref 0.450–4.500)

## 2023-10-03 NOTE — Progress Notes (Signed)
 Patient was agreeable for colonoscopy so I have put that order in.  I have called radiology to see if they can read the pet scan as well

## 2023-10-03 NOTE — Addendum Note (Signed)
 Addended by: VICCI HUSBAND A on: 10/03/2023 01:08 PM   Modules accepted: Orders

## 2023-10-03 NOTE — Progress Notes (Signed)
 Results thru my chart

## 2023-10-09 ENCOUNTER — Other Ambulatory Visit: Payer: Self-pay | Admitting: Medical

## 2023-10-13 DIAGNOSIS — G4730 Sleep apnea, unspecified: Secondary | ICD-10-CM | POA: Diagnosis not present

## 2023-10-17 ENCOUNTER — Telehealth: Payer: Self-pay

## 2023-10-17 NOTE — Telephone Encounter (Signed)
 Copied from CRM [id number]. Topic: Clinical - Lab/Test Results >> Oct 17, 2023 11:06 AM Luke Orr wrote: Reason for CRM: Patient called in stating he had a CAT scan on prostate and results were suppose to get sent to PCP and be followed up with but haven't heard anything and it's been about a month ago. Please call [phone]

## 2023-10-17 NOTE — Telephone Encounter (Signed)
 Pt states he heard back from the PET scan but they were going to send something to you as they saw something else on the scan. Looks like 09/27/23 scan from Dr. Watt is under imaging if you want to take a look at results. Dr. Watt will be handling his prostate issues

## 2023-10-18 NOTE — Telephone Encounter (Signed)
 Pt was notified.

## 2023-10-25 ENCOUNTER — Encounter: Payer: Self-pay | Admitting: Medical

## 2023-10-28 ENCOUNTER — Ambulatory Visit (INDEPENDENT_AMBULATORY_CARE_PROVIDER_SITE_OTHER): Admitting: Medical

## 2023-10-28 ENCOUNTER — Encounter: Payer: Self-pay | Admitting: Medical

## 2023-10-28 VITALS — BP 120/70 | HR 71 | Wt 212.4 lb

## 2023-10-28 DIAGNOSIS — G4733 Obstructive sleep apnea (adult) (pediatric): Secondary | ICD-10-CM | POA: Insufficient documentation

## 2023-10-28 DIAGNOSIS — I10 Essential (primary) hypertension: Secondary | ICD-10-CM | POA: Diagnosis not present

## 2023-10-28 DIAGNOSIS — J90 Pleural effusion, not elsewhere classified: Secondary | ICD-10-CM | POA: Insufficient documentation

## 2023-10-28 DIAGNOSIS — R3989 Other symptoms and signs involving the genitourinary system: Secondary | ICD-10-CM

## 2023-10-28 DIAGNOSIS — R5383 Other fatigue: Secondary | ICD-10-CM

## 2023-10-28 DIAGNOSIS — Z1211 Encounter for screening for malignant neoplasm of colon: Secondary | ICD-10-CM | POA: Diagnosis not present

## 2023-10-28 DIAGNOSIS — Z6833 Body mass index (BMI) 33.0-33.9, adult: Secondary | ICD-10-CM | POA: Insufficient documentation

## 2023-10-28 NOTE — Progress Notes (Signed)
 Can we check on the status of the GI referral that was placed a month ago

## 2023-10-28 NOTE — Patient Instructions (Addendum)
 Please go to [facility] for your chest xray.   Their hours are 8am - 4:30 pm Monday - Friday.  Take your insurance card with you.  [facility] [phone]   [address] [place], [place] [address]    Sleep apnea Your recent sleep test does show that you have moderate sleep apnea Recommendations including avoid laying flat on your back for sleep, try to lose weight through healthy diet and exercise We are referring you today to [facility] for trial of CPAP device to help treat sleep apnea I would need to see you back about a month after starting the CPAP device If you have problems with the mask or trouble with this then contact [facility] right away   Hypertension Continue lisinopril  HCT 20/12.5 mg daily Continue amlodipine  10 mg a you added back last month Fatigue-referral to [facility] for home sleep study.  Given his uncontrolled blood pressure in recent months given symptoms including headaches recently, fatigue and daytime somnolence, he very likely could have some sleep apnea   Pleural effusion/pocket of fluid in your lungs seen on recent PET scan through urology Go for updated chest x-ray today   Screening for colon cancer We will check on the status of your referral   Prosatate finding on PET scan for prostate and history of elevated PSA-follow-up urologist  for biopsy next month

## 2023-10-28 NOTE — Addendum Note (Signed)
 Addended by: VICCI HUSBAND A on: 10/28/2023 02:52 PM   Modules accepted: Orders

## 2023-10-28 NOTE — Progress Notes (Signed)
 Subjective:  Luke Orr is a 73 y.o. male who presents for Chief Complaint  Patient presents with   Consult    Discuss sleep study   Here for follow-up from recent sleep study.  I saw him about a month ago regarding fatigue, hospital follow-up, high blood pressure.  Hypertension-compliant with lisinopril  HCT 20/12.5 mg, and he did add back amlodipine  10 mg daily last visit.  Home blood pressures have been looking good.  Last visit we referred back for updated colon cancer screening but he has not heard any news about appointment time for this  He is seeing urology regarding abnormal PSA and recent PET scan findings of area of concern on the prostate.  He is having a biopsy next month  He reports compliance with Crestor  cholesterol medicine and metformin   No other aggravating or relieving factors.    No other c/o.  Past Medical History:  Diagnosis Date   Abnormal abdominal ultrasound 03/2018   no AAA, atherosclerosis present   Arthritis    right knee   BPH (benign prostatic hypertrophy)    with urinary urgency and weak stream, followed by Dr. Norleen Seltzer, [facility]   Bulging lumbar disc    self reported   DDD (degenerative disc disease), lumbar    Elevated PSA    Dr. Seltzer, [facility]   Full dentures    H/O echocardiogram 03/2018   [facility], mod concetric LVH, EF 58%. mild left atrial enlargement   Hyperlipidemia    Hypertension    Impaired fasting blood sugar    Obesity    Obesity    OSA (obstructive sleep apnea) 10/28/2023   Current Outpatient Medications on File Prior to Visit  Medication Sig Dispense Refill   amLODipine  (NORVASC ) 10 MG tablet Take 1 tablet (10 mg total) by mouth daily. 90 tablet 1   ASPIRIN  LOW DOSE 81 MG tablet TAKE 1 TABLET BY MOUTH EVERY DAY 90 tablet 0   cetirizine  (ZYRTEC ) 10 MG tablet Take 1 tablet (10 mg total) by mouth at bedtime. 30 tablet 11   cholecalciferol (VITAMIN D3) 25 MCG (1000 UNIT) tablet Take  1,000 Units by mouth daily.     lisinopril -hydrochlorothiazide  (ZESTORETIC ) 20-12.5 MG tablet Take 1 tablet by mouth daily. 90 tablet 1   metFORMIN  (GLUCOPHAGE ) 500 MG tablet TAKE 1 TABLET BY MOUTH EVERY DAY WITH BREAKFAST 90 tablet 1   rosuvastatin  (CRESTOR ) 20 MG tablet TAKE 1 TABLET BY MOUTH EVERY DAY 90 tablet 1   vitamin E 1000 UNIT capsule Take 1,000 Units by mouth daily.     No current facility-administered medications on file prior to visit.     The following portions of the patient's history were reviewed and updated as appropriate: allergies, current medications, past family history, past medical history, past social history, past surgical history and problem list.  ROS Otherwise as in subjective above  Objective: BP 120/70   Pulse 71   Wt 212 lb 6.4 oz (96.3 kg)   BMI 33.27 kg/m   BP Readings from Last 3 Encounters:  10/28/23 120/70  10/02/23 (!) 160/82  09/27/23 (!) 181/105   Wt Readings from Last 3 Encounters:  10/28/23 212 lb 6.4 oz (96.3 kg)  10/02/23 213 lb 3.2 oz (96.7 kg)  09/27/23 216 lb (98 kg)    General appearance: alert, no distress, well developed, well nourished Neck: supple, no lymphadenopathy, no thyromegaly, no masses Heart: RRR, normal S1, S2, no murmurs Lungs: CTA bilaterally, no wheezes, rhonchi, or rales  Pulses: 2+ radial pulses, 2+ pedal pulses, normal cap refill Ext: no edema    Assessment: Encounter Diagnoses  Name Primary?   OSA (obstructive sleep apnea) Yes   Essential hypertension    Pleural effusion    Fatigue, unspecified type    BMI 33.0-33.9,adult    Screening for colon cancer    Abnormal prostate exam       Plan: Sleep apnea Your recent sleep test does show that you have moderate sleep apnea Recommendations including avoid laying flat on your back for sleep, try to lose weight through healthy diet and exercise We are referring you today to [facility] for trial of CPAP device to help treat sleep apnea I would need to  see you back about a month after starting the CPAP device If you have problems with the mask or trouble with this then contact [facility] right away   Hypertension Continue lisinopril  HCT 20/12.5 mg daily Continue amlodipine  10 mg a you added back last month Fatigue-referral to [facility] for home sleep study.  Given his uncontrolled blood pressure in recent months given symptoms including headaches recently, fatigue and daytime somnolence, he very likely could have some sleep apnea   Pleural effusion/pocket of fluid in your lungs seen on recent PET scan through urology Go for updated chest x-ray today   Screening for colon cancer We will check on the status of your referral   Prosatate finding on PET scan for prostate and history of elevated PSA-follow-up urologist  for biopsy next month   Brandi was seen today for consult.  Diagnoses and all orders for this visit:  OSA (obstructive sleep apnea)  Essential hypertension  Pleural effusion -     DG Chest 2 View; Future  Fatigue, unspecified type  BMI 33.0-33.9,adult  Screening for colon cancer  Abnormal prostate exam     Follow up: CPAP trial

## 2023-10-31 ENCOUNTER — Ambulatory Visit
Admission: RE | Admit: 2023-10-31 | Discharge: 2023-10-31 | Disposition: A | Discharge status: Home / Self Care | Source: Ambulatory Visit | Attending: Medical | Admitting: Medical

## 2023-10-31 DIAGNOSIS — J90 Pleural effusion, not elsewhere classified: Secondary | ICD-10-CM | POA: Diagnosis not present

## 2023-11-07 ENCOUNTER — Ambulatory Visit: Payer: Self-pay | Admitting: Medical

## 2023-11-07 NOTE — Progress Notes (Signed)
 The prior small amount of fluid seems less now.  I would just recommend we monitor this for now and possibly repeat chest x-ray again in 6 to 8 months

## 2023-11-15 ENCOUNTER — Other Ambulatory Visit: Payer: Self-pay | Admitting: Medical

## 2023-11-21 ENCOUNTER — Encounter: Payer: Self-pay | Admitting: Internal Medicine

## 2023-11-25 ENCOUNTER — Encounter: Payer: Self-pay | Admitting: Internal Medicine

## 2023-11-25 ENCOUNTER — Ambulatory Visit (INDEPENDENT_AMBULATORY_CARE_PROVIDER_SITE_OTHER): Payer: Medicare Other | Admitting: Medical

## 2023-11-25 VITALS — BP 120/70 | HR 76 | Ht 67.5 in | Wt 214.4 lb

## 2023-11-25 DIAGNOSIS — N401 Enlarged prostate with lower urinary tract symptoms: Secondary | ICD-10-CM

## 2023-11-25 DIAGNOSIS — R7303 Prediabetes: Secondary | ICD-10-CM | POA: Diagnosis not present

## 2023-11-25 DIAGNOSIS — R7301 Impaired fasting glucose: Secondary | ICD-10-CM

## 2023-11-25 DIAGNOSIS — R972 Elevated prostate specific antigen [PSA]: Secondary | ICD-10-CM

## 2023-11-25 DIAGNOSIS — Z23 Encounter for immunization: Secondary | ICD-10-CM | POA: Diagnosis not present

## 2023-11-25 DIAGNOSIS — E785 Hyperlipidemia, unspecified: Secondary | ICD-10-CM | POA: Diagnosis not present

## 2023-11-25 DIAGNOSIS — Z0000 Encounter for general adult medical examination without abnormal findings: Secondary | ICD-10-CM

## 2023-11-25 DIAGNOSIS — I700 Atherosclerosis of aorta: Secondary | ICD-10-CM | POA: Diagnosis not present

## 2023-11-25 DIAGNOSIS — R5383 Other fatigue: Secondary | ICD-10-CM | POA: Diagnosis not present

## 2023-11-25 DIAGNOSIS — G4733 Obstructive sleep apnea (adult) (pediatric): Secondary | ICD-10-CM

## 2023-11-25 DIAGNOSIS — I10 Essential (primary) hypertension: Secondary | ICD-10-CM | POA: Diagnosis not present

## 2023-11-25 NOTE — Progress Notes (Signed)
 Subjective:   HPI  Luke Orr is a 73 y.o. male who presents for Chief Complaint  Patient presents with   Annual Exam    CPE, ate at 11am, no concerns    Patient Care Team: Jermany Sundell, Alm RAMAN, PA-C as PCP - General (Family Medicine) Watt Rush, MD as Attending Physician (Urology) Eye doctor Dentist   Concerns: Here for well visit  He has a prostate biopsy coming up soon with Dr. Watt.  He has colonoscopy for consult next month  He is agreeable to flu shot today    Reviewed their medical, surgical, family, social, medication, and allergy history and updated chart as appropriate.  Allergies  Allergen Reactions   Morphine And Codeine Shortness Of Breath    Past Medical History:  Diagnosis Date   Abnormal abdominal ultrasound 03/2018   no AAA, atherosclerosis present   Arthritis    right knee   BPH (benign prostatic hypertrophy)    with urinary urgency and weak stream, followed by Dr. Rush Watt, [facility]   Bulging lumbar disc    self reported   DDD (degenerative disc disease), lumbar    Elevated PSA    Dr. Watt, [facility]   Full dentures    H/O echocardiogram 03/2018   [facility], mod concetric LVH, EF 58%. mild left atrial enlargement   Hyperlipidemia    Hypertension    Impaired fasting blood sugar    Obesity    Obesity    OSA (obstructive sleep apnea) 10/28/2023     Current Outpatient Medications:    amLODipine  (NORVASC ) 10 MG tablet, Take 1 tablet (10 mg total) by mouth daily., Disp: 90 tablet, Rfl: 1   ASPIRIN  LOW DOSE 81 MG tablet, TAKE 1 TABLET BY MOUTH EVERY DAY, Disp: 90 tablet, Rfl: 0   cetirizine  (ZYRTEC ) 10 MG tablet, Take 1 tablet (10 mg total) by mouth at bedtime., Disp: 30 tablet, Rfl: 11   cholecalciferol (VITAMIN D3) 25 MCG (1000 UNIT) tablet, Take 1,000 Units by mouth daily., Disp: , Rfl:    lisinopril -hydrochlorothiazide  (ZESTORETIC ) 20-12.5 MG tablet, Take 1 tablet by mouth daily., Disp: 90 tablet, Rfl:  1   metFORMIN  (GLUCOPHAGE ) 500 MG tablet, TAKE 1 TABLET BY MOUTH EVERY DAY WITH BREAKFAST, Disp: 90 tablet, Rfl: 1   rosuvastatin  (CRESTOR ) 20 MG tablet, TAKE 1 TABLET BY MOUTH EVERY DAY, Disp: 90 tablet, Rfl: 0   vitamin E 1000 UNIT capsule, Take 1,000 Units by mouth daily., Disp: , Rfl:   Family History  Problem Relation Age of Onset   Heart disease Mother    Diabetes Mother    Hypertension Mother    Heart disease Father    Hypertension Sister    Diabetes Sister    Hypertension Sister    Diabetes Sister    Hypertension Sister    Diabetes Sister    Hypertension Sister    Hypertension Sister    Aneurysm Sister        brain   Hypertension Brother    Cancer Brother 79       prostate   Heart disease Brother 86       CABG, died of CHF   Hypertension Brother    Diabetes Brother    Colon cancer Neg Hx    Stroke Neg Hx    Sleep apnea Neg Hx     Past Surgical History:  Procedure Laterality Date   COLONOSCOPY  2015   mild diverticulosis, repeat 2025; Dr. Avram FONTANA HERNIA  REPAIR Right 1995   PROSTATE BIOPSY  2014   x 2; Dr. Watt   Review of Systems  Constitutional:  Positive for malaise/fatigue. Negative for chills, fever and weight loss.  HENT:  Negative for congestion, ear pain, hearing loss, sore throat and tinnitus.   Eyes:  Negative for blurred vision, pain and redness.  Respiratory:  Negative for cough, hemoptysis and shortness of breath.   Cardiovascular:  Negative for chest pain, palpitations, orthopnea, claudication and leg swelling.  Gastrointestinal:  Negative for abdominal pain, blood in stool, constipation, diarrhea, nausea and vomiting.  Genitourinary:  Negative for dysuria, flank pain, frequency, hematuria and urgency.  Musculoskeletal:  Positive for back pain and myalgias. Negative for falls and joint pain.  Skin:  Negative for itching and rash.  Neurological:  Negative for dizziness, tingling, speech change, weakness and headaches.   Endo/Heme/Allergies:  Negative for polydipsia. Does not bruise/bleed easily.  Psychiatric/Behavioral:  Negative for depression and memory loss. The patient is not nervous/anxious and does not have insomnia.       Objective:  BP 120/70   Pulse 76   Ht 5' 7.5 (1.715 m)   Wt 214 lb 6.4 oz (97.3 kg)   BMI 33.08 kg/m   General appearance: alert, no distress, WD/WN, African American male Skin: unremarkable HEENT: normocephalic, conjunctiva/corneas normal, sclerae anicteric, PERRLA, EOMi, left nare inferiorly with 4mm lump or polyp unchanged for years per patient, otherwise no discharge or erythema, pharynx normal Oral cavity: MMM, tongue normal, teeth - dentures Neck: supple, no lymphadenopathy, no thyromegaly, no masses, normal ROM, no bruits Chest: non tender, normal shape and expansion Heart: RRR, normal S1, S2, no murmurs Lungs: CTA bilaterally, no wheezes, rhonchi, or rales Abdomen: +bs, soft, non tender, non distended, no masses, no hepatomegaly, no splenomegaly, no bruits Back: non tender, normal ROM, no scoliosis Musculoskeletal: upper extremities non tender, no obvious deformity, normal ROM throughout, lower extremities non tender, no obvious deformity, normal ROM throughout Extremities: no edema, no cyanosis, no clubbing Pulses: 2+ symmetric, upper and lower extremities, normal cap refill Neurological: alert, oriented x 3, CN2-12 intact, strength normal upper extremities and lower extremities, sensation normal throughout, DTRs 2+ throughout, no cerebellar signs, gait normal Psychiatric: normal affect, behavior normal, pleasant  GU/rectal - declined, deferred   Assessment and Plan :   Encounter Diagnoses  Name Primary?   Encounter for health maintenance examination in adult Yes   Needs flu shot    Benign prostatic hyperplasia with lower urinary tract symptoms, symptom details unspecified    Atherosclerosis of aorta    Elevated PSA    Essential hypertension    Fatigue,  unspecified type    Prediabetes    Hyperlipidemia, unspecified hyperlipidemia type    Impaired fasting blood sugar    OSA (obstructive sleep apnea)     This visit was a preventative care visit, also known as wellness visit or routine physical.   Topics typically include healthy lifestyle, diet, exercise, preventative care, vaccinations, sick and well care, proper use of emergency dept and after hours care, as well as other concerns.     Separate significant issues discussed: HTN - continue current medication Amlodipine  10mg  daily, lisinopril  HCT 20/12.5mg  daily  Hyperlipidemia - continue current medicaiton, Crestor  20mg  daily, labs today  Impaired glucose - labs today, continue metformin  500mg  daily  OSA - expect phone call about CPAP referral  Fatigue - could be several potential causes.  Plan treatment for OSA  Elevated PSA - follow up with urology as  planned for biopsy  General Recommendations: Continue to return yearly for your annual wellness and preventative care visits.  This gives us  a chance to discuss healthy lifestyle, exercise, vaccinations, review your chart record, and perform screenings where appropriate.  I recommend you see your eye doctor yearly for routine vision care.   Vaccination  Immunization History  Administered Date(s) Administered   Fluad Quad(high Dose 65+) 11/18/2019, 11/03/2020   Fluad Trivalent(High Dose 65+) 11/22/2022   INFLUENZA, HIGH DOSE SEASONAL PF 11/25/2023   Moderna Covid-19 Vaccine Bivalent Booster 15yrs & up 12/13/2020   Moderna Sars-Covid-2 Vaccination 04/19/2019, 05/17/2019, 02/01/2020   Pneumococcal Conjugate-13 09/24/2019   Pneumococcal Polysaccharide-23 12/15/2015   Tdap 09/15/2014    Vaccine recommendations: Counseled on the influenza virus vaccine.  Vaccine information sheet given.  Influenza vaccine given after consent obtained.  Advised shingrix vaccine   Screening for cancer: Colon cancer screening: Follow up with  GI soon as planned  Prior or last colon cancer screen: 2015   Prostate Cancer screening: Follow up with urology as planned soon for prostate biopsy   Skin cancer screening: Check your skin regularly for new changes, growing lesions, or other lesions of concern Come in for evaluation if you have skin lesions of concern.   Lung cancer screening: If you have a greater than 20 pack year history of tobacco use, then you may qualify for lung cancer screening with a chest CT scan.   Please call your insurance company to inquire about coverage for this test.   Pancreatic cancer:  no current screening test is available or routinely recommended. (risk factors: smoking, overweight or obese, diabetes, chronic pancreatitis, work exposure - dry cleaning, metal working, 73yo>, M>F, Tree surgeon, family hx/o, hereditary breast, ovarian, melanoma, lynch, peutz-jeghers).  Symptoms: jaundice, dark urine, light color or greasy stools, itchy skin, belly or back pain, weight loss, poor appetite, nausea, vomiting, liver enlargement, DVT/blood clots.   We currently don't have screenings for other cancers besides breast, cervical, colon, and lung cancers.  If you have a strong family history of cancer or have other cancer screening concerns, please let me know.  Genetic testing referral is an option for individuals with high cancer risk in the family.  There are some other cancer screenings in development currently.   Bone health: Get at least 150 minutes of aerobic exercise weekly Get weight bearing exercise at least once weekly Bone density test:  A bone density test is an imaging test that uses a type of X-ray to measure the amount of calcium  and other minerals in your bones. The test may be used to diagnose or screen you for a condition that causes weak or thin bones (osteoporosis), predict your risk for a broken bone (fracture), or determine how well your osteoporosis treatment is working. The bone  density test is recommended for females 65 and older, or females or males <65 if certain risk factors such as thyroid disease, long term use of steroids such as for asthma or rheumatological issues, vitamin D deficiency, estrogen deficiency, family history of osteoporosis, self or family history of fragility fracture in first degree relative.    Heart health: Get at least 150 minutes of aerobic exercise weekly Limit alcohol It is important to maintain a healthy blood pressure and healthy cholesterol numbers  Heart disease screening: Screening for heart disease includes screening for blood pressure, fasting lipids, glucose/diabetes screening, BMI height to weight ratio, reviewed of smoking status, physical activity, and diet.    Goals include blood  pressure 120/80 or less, maintaining a healthy lipid/cholesterol profile, preventing diabetes or keeping diabetes numbers under good control, not smoking or using tobacco products, exercising most days per week or at least 150 minutes per week of exercise, and eating healthy variety of fruits and vegetables, healthy oils, and avoiding unhealthy food choices like fried food, fast food, high sugar and high cholesterol foods.    Other tests may possibly include EKG test, CT coronary calcium  score, echocardiogram, exercise treadmill stress test.       Vascular disease screening: For higher risk individuals including smokers, diabetics, patients with known heart disease or high blood pressure, kidney disease, and others, screening for vascular disease or atherosclerosis of the arteries is available.  Examples may include carotid ultrasound, abdominal aortic ultrasound, ABI blood flow screening in the legs, thoracic aorta screening.  Normal ABI 01/2021   Medical care options: I recommend you continue to seek care here first for routine care.  We try really hard to have available appointments Or[place] through Friday daytime hours for sick visits, acute  visits, and physicals.  Urgent care should be used for after hours and weekends for significant issues that cannot wait till the next day.  The emergency department should be used for significant potentially life-threatening emergencies.  The emergency department is expensive, can often have long wait times for less significant concerns, so try to utilize primary care, urgent care, or telemedicine when possible to avoid unnecessary trips to the emergency department.  Virtual visits and telemedicine have been introduced since the pandemic started in 2020, and can be convenient ways to receive medical care.  We offer virtual appointments as well to assist you in a variety of options to seek medical care.   Legal  Take the time to do a last will and testament, Advanced Directives including Health Care Power of Attorney and Living Will documents.  Don't leave your family with burdens that can be handled ahead of time.   Advanced Directives: I recommend you consider completing a Health Care Power of Attorney and Living Will.   These documents respect your wishes and help alleviate burdens on your loved ones if you were to become terminally ill or be in a position to need those documents enforced.    You can complete Advanced Directives yourself, have them notarized, then have copies made for our office, for you and for anybody you feel should have them in safe keeping.  Or, you can have an attorney prepare these documents.   If you haven't updated your Last Will and Testament in a while, it may be worthwhile having an attorney prepare these documents together and save on some costs.        River was seen today for annual exam.  Diagnoses and all orders for this visit:  Encounter for health maintenance examination in adult -     Hemoglobin A1c; Future -     Lipid panel; Future -     Hepatic function panel; Future -     Microalbumin/Creatinine Ratio, Urine; Future  Needs flu shot -     Flu  vaccine HIGH DOSE PF(Fluzone Trivalent)  Benign prostatic hyperplasia with lower urinary tract symptoms, symptom details unspecified  Atherosclerosis of aorta  Elevated PSA  Essential hypertension  Fatigue, unspecified type  Prediabetes  Hyperlipidemia, unspecified hyperlipidemia type -     Lipid panel; Future -     Hepatic function panel; Future -     Microalbumin/Creatinine Ratio, Urine; Future  Impaired fasting  blood sugar -     Hemoglobin A1c; Future -     Microalbumin/Creatinine Ratio, Urine; Future  OSA (obstructive sleep apnea)   Follow-up pending labs, yearly for physical

## 2023-11-25 NOTE — Progress Notes (Signed)
 I have reached out to [facility] with [facility] about his order

## 2023-11-28 ENCOUNTER — Other Ambulatory Visit

## 2023-11-28 ENCOUNTER — Encounter: Payer: Self-pay | Admitting: Internal Medicine

## 2023-11-28 ENCOUNTER — Ambulatory Visit (AMBULATORY_SURGERY_CENTER): Admitting: *Deleted

## 2023-11-28 VITALS — Ht 67.5 in | Wt 212.0 lb

## 2023-11-28 DIAGNOSIS — R7301 Impaired fasting glucose: Secondary | ICD-10-CM

## 2023-11-28 DIAGNOSIS — Z0000 Encounter for general adult medical examination without abnormal findings: Secondary | ICD-10-CM

## 2023-11-28 DIAGNOSIS — E785 Hyperlipidemia, unspecified: Secondary | ICD-10-CM

## 2023-11-28 DIAGNOSIS — Z1211 Encounter for screening for malignant neoplasm of colon: Secondary | ICD-10-CM

## 2023-11-28 MED ORDER — NA SULFATE-K SULFATE-MG SULF 17.5-3.13-1.6 GM/177ML PO SOLN: 1.0000 | Freq: Once | ORAL | 0 refills | Status: AC

## 2023-11-28 NOTE — Progress Notes (Addendum)
 Pt's name and DOB verified at the beginning of the pre-visit with 2 identifiers  Permission given to speak with  Pt denies any difficulty with ambulating,sitting, laying down or rolling side to side  Pt has no issues moving head neck or swallowing  No egg or soy allergy known to patient   No issues known to pt with past sedation  No FH of Malignant Hyperthermia  Pt is not on home 02   Pt is not on blood thinners   Pt denies issues with constipation   Pt is not on dialysis  Pt denise any abnormal heart rhythms   Pt denies any upcoming cardiac testing   Chart not reviewed by CRNA prior to PV  Visit by phone  Pt states weight is 212 lb  Pt given  both [facility] main # and MD on call # prior to instructions.  Informed pt to come in at the time discussed and is shown on PV instructions.  Pt instructed to use [employer] or GoodRx for a price reduction on prep  Instructed pt where to find PV instructions in My Chart  Instructed pt on all aspects of written instructions including med holds clothing to wear and foods to eat and not eat as well as after procedure legal restrictions and to call MD on call if needed.. Pt states understanding. Instructed pt to review instructions again prior to procedure and call main # given if has any questions or any issues. Pt states they will.

## 2023-11-29 LAB — LIPID PANEL
Chol/HDL Ratio: 2.9 ratio (ref 0.0–5.0)
Cholesterol, Total: 144 mg/dL (ref 100–199)
HDL: 50 mg/dL (ref >39)
LDL Chol Calc (NIH): 79 mg/dL (ref 0–99)
Triglycerides: 74 mg/dL (ref 0–149)
VLDL Cholesterol Cal: 15 mg/dL (ref 5–40)

## 2023-11-29 LAB — HEPATIC FUNCTION PANEL
ALT: 21 IU/L (ref 0–44)
AST: 21 IU/L (ref 0–40)
Albumin: 3.9 g/dL (ref 3.9–4.9)
Alkaline Phosphatase: 89 IU/L (ref 47–123)
Bilirubin Total: 0.5 mg/dL (ref 0.0–1.2)
Bilirubin, Direct: 0.23 mg/dL (ref 0.00–0.40)
Total Protein: 6.3 g/dL (ref 6.0–8.5)

## 2023-11-29 LAB — MICROALBUMIN / CREATININE URINE RATIO
Creatinine, Urine: 94.2 mg/dL
Microalb/Creat Ratio: 35 mg/g{creat} — ABNORMAL HIGH (ref 0–29)
Microalbumin, Urine: 33.2 ug/mL

## 2023-11-29 LAB — HEMOGLOBIN A1C
Est. average glucose Bld gHb Est-mCnc: 134 mg/dL
Hgb A1c MFr Bld: 6.3 % — ABNORMAL HIGH (ref 4.8–5.6)

## 2023-12-01 ENCOUNTER — Ambulatory Visit: Payer: Self-pay | Admitting: Medical

## 2023-12-01 ENCOUNTER — Other Ambulatory Visit: Payer: Self-pay | Admitting: Medical

## 2023-12-01 MED ORDER — METFORMIN HCL 500 MG PO TABS: 250.0000 mg | ORAL_TABLET | Freq: Every day | ORAL | Status: AC

## 2023-12-01 NOTE — Progress Notes (Signed)
 Labs show liver test okay, cholesterol looks good, diabetes marker 6.3% but microalbumin is abnormal suggesting some early signs of kidney damage from high blood pressure and/or diabetes  I would like to change his metformin  to 1/2 tablet daily or 250 mg daily.  However if he cannot cut this in half or if that is not feasible we will just discontinue this for now  Continue rest of medicines as usual  Drink at least 80 to 100 ounces of water daily  Lets recheck in 4 to 6 months

## 2023-12-02 DIAGNOSIS — R3912 Poor urinary stream: Secondary | ICD-10-CM | POA: Diagnosis not present

## 2023-12-02 DIAGNOSIS — R9349 Abnormal radiologic findings on diagnostic imaging of other urinary organs: Secondary | ICD-10-CM | POA: Diagnosis not present

## 2023-12-02 DIAGNOSIS — R3915 Urgency of urination: Secondary | ICD-10-CM | POA: Diagnosis not present

## 2023-12-09 ENCOUNTER — Telehealth: Payer: Self-pay | Admitting: Internal Medicine

## 2023-12-09 NOTE — Telephone Encounter (Signed)
 Spoke with patient. Advised that he can proceed as scheduled but it is recommended that he make sure that when he has his biopsy tomorrow he makes sure this is ok with the physician doing the procedure. Pt will call back if he needs to be r/s.

## 2023-12-09 NOTE — Telephone Encounter (Signed)
 Inbound call from patient stating he has a prostate biopsy scheduled for Wednesday. Patient is requesting a call to discuss if he is able to proceed with colonoscopy on 10/9. Please advise, thank you

## 2023-12-11 NOTE — Telephone Encounter (Signed)
 Called patient back with no answer.  Mailbox is full.  Updated prep instructions to reflect new colon appt. on 02/04/24.  Sent in MyChart and  mailed to home address.

## 2023-12-11 NOTE — Telephone Encounter (Signed)
 PT is calling to advise that physician wants him postpone procedure. Requesting that updated instructions be sent to patient. Please advise.

## 2023-12-12 ENCOUNTER — Encounter: Admitting: Internal Medicine

## 2023-12-26 ENCOUNTER — Ambulatory Visit

## 2023-12-26 DIAGNOSIS — Z0000 Encounter for general adult medical examination without abnormal findings: Secondary | ICD-10-CM

## 2023-12-26 NOTE — Progress Notes (Signed)
 Subjective:   Luke Orr is a 73 y.o. who presents for a Medicare Wellness preventive visit.  As a reminder, Annual Wellness Visits don't include a physical exam, and some assessments may be limited, especially if this visit is performed virtually. We may recommend an in-person follow-up visit with your provider if needed.  Visit Complete: Virtual I connected with  Luke Orr on 12/26/23 by a audio enabled telemedicine application and verified that I am speaking with the correct person using two identifiers.  Patient Location: Home  Provider Location: Home Office  I discussed the limitations of evaluation and management by telemedicine. The patient expressed understanding and agreed to proceed.  Vital Signs: Because this visit was a virtual/telehealth visit, some criteria may be missing or patient reported. Any vitals not documented were not able to be obtained and vitals that have been documented are patient reported.  VideoError- Librarian, academic were attempted between this provider and patient, however failed, due to patient having technical difficulties OR patient did not have access to video capability.  We continued and completed visit with audio only.   Persons Participating in Visit: Patient.  AWV Questionnaire: No: Patient Medicare AWV questionnaire was not completed prior to this visit.  Cardiac Risk Factors include: advanced age (>60men, >54 women);dyslipidemia;hypertension;male gender     Objective:    Today's Vitals   There is no height or weight on file to calculate BMI.     12/26/2023    2:53 PM 09/27/2023   11:07 AM 11/20/2022   11:05 AM 11/17/2021    8:45 AM 12/15/2013    1:44 PM 12/11/2013    3:29 PM  Advanced Directives  Does Patient Have a Medical Advance Directive? No No Yes No No  No   Type of Surveyor, minerals;Living will     Copy of Healthcare Power of Attorney in Chart?   No - copy  requested     Would patient like information on creating a medical advance directive? No - Patient declined    No - patient declined information       Data saved with a previous flowsheet row definition    Current Medications (verified) Outpatient Encounter Medications as of 12/26/2023  Medication Sig   amLODipine  (NORVASC ) 10 MG tablet Take 1 tablet (10 mg total) by mouth daily.   ASPIRIN  LOW DOSE 81 MG tablet TAKE 1 TABLET BY MOUTH EVERY DAY   cetirizine  (ZYRTEC ) 10 MG tablet Take 1 tablet (10 mg total) by mouth at bedtime.   cholecalciferol (VITAMIN D3) 25 MCG (1000 UNIT) tablet Take 1,000 Units by mouth daily.   lisinopril -hydrochlorothiazide  (ZESTORETIC ) 20-12.5 MG tablet Take 1 tablet by mouth daily.   rosuvastatin  (CRESTOR ) 20 MG tablet TAKE 1 TABLET BY MOUTH EVERY DAY   vitamin E 1000 UNIT capsule Take 1,000 Units by mouth daily.   metFORMIN  (GLUCOPHAGE ) 500 MG tablet Take 0.5 tablets (250 mg total) by mouth daily with breakfast. TAKE 1 TABLET BY MOUTH EVERY DAY WITH BREAKFAST (Patient not taking: Reported on 12/26/2023)   No facility-administered encounter medications on file as of 12/26/2023.    Allergies (verified) Morphine and codeine   History: Past Medical History:  Diagnosis Date   Abnormal abdominal ultrasound 03/2018   no AAA, atherosclerosis present   Arthritis    right knee   BPH (benign prostatic hypertrophy)    with urinary urgency and weak stream, followed by Dr. Norleen Seltzer, [facility]  Bulging lumbar disc    self reported   DDD (degenerative disc disease), lumbar    Diabetes mellitus without complication (HCC)    Pre-diabetic   Elevated PSA    Dr. Watt, [facility]   Full dentures    H/O echocardiogram 03/2018   [facility], mod concetric LVH, EF 58%. mild left atrial enlargement   Hyperlipidemia    Hypertension    Impaired fasting blood sugar    Obesity    Obesity    OSA (obstructive sleep apnea) 10/28/2023   Sleep  apnea    Past Surgical History:  Procedure Laterality Date   COLONOSCOPY  2015   mild diverticulosis, repeat 2025; Dr. Avram FONTANA HERNIA REPAIR Right 1995   PROSTATE BIOPSY  2014   x 2; Dr. Watt   PROSTATE BIOPSY     12/2023   Family History  Problem Relation Age of Onset   Heart disease Mother    Diabetes Mother    Hypertension Mother    Heart disease Father    Hypertension Sister    Diabetes Sister    Hypertension Sister    Diabetes Sister    Hypertension Sister    Diabetes Sister    Hypertension Sister    Hypertension Sister    Aneurysm Sister        brain   Hypertension Brother    Cancer Brother 50       prostate   Heart disease Brother 33       CABG, died of CHF   Hypertension Brother    Diabetes Brother    Colon cancer Neg Hx    Stroke Neg Hx    Sleep apnea Neg Hx    Colon polyps Neg Hx    Esophageal cancer Neg Hx    Rectal cancer Neg Hx    Stomach cancer Neg Hx    Social History   Socioeconomic History   Marital status: Married    Spouse name: Not on file   Number of children: 3   Years of education: Not on file   Highest education level: Not on file  Occupational History   Not on file  Tobacco Use   Smoking status: Former    Current packs/day: 0.00    Average packs/day: 1 pack/day for 5.0 years (5.0 ttl pk-yrs)    Types: Cigarettes    Start date: 03/05/1998    Quit date: 03/06/2003    Years since quitting: 20.8   Smokeless tobacco: Never  Vaping Use   Vaping status: Never Used  Substance and Sexual Activity   Alcohol use: Yes    Alcohol/week: 7.0 standard drinks of alcohol    Types: 7 Glasses of wine per week    Comment: red wine every night   Drug use: No   Sexual activity: Not on file  Other Topics Concern   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  11/2022   Social Drivers of Health   Financial Resource Strain: Low Risk   (12/26/2023)   Overall Financial Resource Strain (CARDIA)    Difficulty of Paying Living Expenses: Not hard at all  Food Insecurity: No Food Insecurity (12/26/2023)   Hunger Vital Sign    Worried About Running Out of Food in the Last Year: Never true    Ran Out of Food in the Last Year: Never true  Transportation Needs:  No Transportation Needs (12/26/2023)   PRAPARE - Administrator, Civil Service (Medical): No    Lack of Transportation (Non-Medical): No  Physical Activity: Inactive (12/26/2023)   Exercise Vital Sign    Days of Exercise per Week: 0 days    Minutes of Exercise per Session: 0 min  Stress: No Stress Concern Present (12/26/2023)   [employer] of Occupational Health - Occupational Stress Questionnaire    Feeling of Stress: Not at all  Social Connections: Moderately Isolated (12/26/2023)   Social Connection and Isolation Panel    Frequency of Communication with Friends and Family: More than three times a week    Frequency of Social Gatherings with Friends and Family: More than three times a week    Attends Religious Services: Never    Database administrator or Organizations: No    Attends Engineer, structural: Never    Marital Status: Married    Tobacco Counseling Counseling given: Not Answered    Clinical Intake:  Pre-visit preparation completed: Yes  Pain : No/denies pain     Nutritional Risks: None Diabetes: No  Lab Results  Component Value Date   HGBA1C 6.3 (H) 11/28/2023   HGBA1C 6.2 (H) 11/22/2022   HGBA1C 6.3 (A) 05/21/2022     How often do you need to have someone help you when you read instructions, pamphlets, or other written materials from your doctor or pharmacy?: 1 - Never  Interpreter Needed?: No  Information entered by :: NAllen LPN   Activities of Daily Living     12/26/2023    2:46 PM  In your present state of health, do you have any difficulty performing the following activities:  Hearing? 0   Vision? 0  Difficulty concentrating or making decisions? 0  Walking or climbing stairs? 0  Dressing or bathing? 0  Doing errands, shopping? 0  Preparing Food and eating ? N  Using the Toilet? N  In the past six months, have you accidently leaked urine? N  Do you have problems with loss of bowel control? N  Managing your Medications? N  Managing your Finances? N  Housekeeping or managing your Housekeeping? N    Patient Care Team: Tysinger, Luke RAMAN, PA-C as PCP - General (Family Medicine) Watt Rush, MD as Attending Physician (Urology)  I have updated your Care Teams any recent Medical Services you may have received from other providers in the past year.     Assessment:   This is a routine wellness examination for Sergio.  Hearing/Vision screen Hearing Screening - Comments:: Denies hearing issues Vision Screening - Comments:: No regular eye exams,    Goals Addressed             This Visit's Progress    Patient Stated       12/26/2023, start exercise again       Depression Screen     12/26/2023    2:54 PM 10/28/2023   11:02 AM 11/20/2022   11:07 AM 05/21/2022    2:48 PM 11/17/2021    8:46 AM 07/11/2021   10:21 AM 12/13/2020    9:04 AM  PHQ 2/9 Scores  PHQ - 2 Score 0 0 0 0 0 0 0  PHQ- 9 Score 0  0  0      Fall Risk     12/26/2023    2:54 PM 11/25/2023    1:49 PM 10/28/2023   11:02 AM 11/20/2022   11:06 AM 05/21/2022  2:48 PM  Fall Risk   Falls in the past year? 0 0 0 0 0  Number falls in past yr: 0 0 0 0 0  Injury with Fall? 0 0 0 0 0  Risk for fall due to : Medication side effect No Fall Risks No Fall Risks Medication side effect No Fall Risks  Follow up Falls evaluation completed;Falls prevention discussed Falls evaluation completed Falls evaluation completed Falls prevention discussed;Falls evaluation completed Falls evaluation completed    MEDICARE RISK AT HOME:  Medicare Risk at Home Any stairs in or around the home?: Yes If so, are there any  without handrails?: No Home free of loose throw rugs in walkways, pet beds, electrical cords, etc?: Yes Adequate lighting in your home to reduce risk of falls?: Yes Life alert?: No Use of a cane, walker or w/c?: No Grab bars in the bathroom?: No Shower chair or bench in shower?: No Elevated toilet seat or a handicapped toilet?: No  TIMED UP AND GO:  Was the test performed?  No  Cognitive Function: 6CIT completed        12/26/2023    2:55 PM 11/20/2022   11:08 AM 11/17/2021    8:47 AM  6CIT Screen  What Year? 0 points 0 points 0 points  What month? 0 points 0 points 0 points  What time? 0 points 3 points 0 points  Count back from 20 0 points 0 points 0 points  Months in reverse 4 points 4 points 4 points  Repeat phrase 8 points 2 points 8 points  Total Score 12 points 9 points 12 points    Immunizations Immunization History  Administered Date(s) Administered   Fluad Quad(high Dose 65+) 11/18/2019, 11/03/2020   Fluad Trivalent(High Dose 65+) 11/22/2022   INFLUENZA, HIGH DOSE SEASONAL PF 11/25/2023   Moderna Covid-19 Vaccine Bivalent Booster 8yrs & up 12/13/2020   Moderna Sars-Covid-2 Vaccination 04/19/2019, 05/17/2019, 02/01/2020   Pneumococcal Conjugate-13 09/24/2019   Pneumococcal Polysaccharide-23 12/15/2015   Tdap 09/15/2014    Screening Tests Health Maintenance  Topic Date Due   COVID-19 Vaccine (5 - 2025-26 season) 11/04/2023   Colonoscopy  12/16/2023   Zoster Vaccines- Shingrix (1 of 2) 01/02/2024 (Originally 12/28/2002)   DTaP/Tdap/Td (2 - Td or Tdap) 09/14/2024   Pneumococcal Vaccine: 50+ Years (3 of 3 - PCV20 or PCV21) 09/23/2024   Medicare Annual Wellness (AWV)  12/25/2024   Influenza Vaccine  Completed   Hepatitis C Screening  Completed   Meningococcal B Vaccine  Aged Out    Health Maintenance Items Addressed: Colonoscopy scheduled for 02/09/2024. Declines covid vaccines  Additional Screening:  Vision Screening: Recommended annual ophthalmology  exams for early detection of glaucoma and other disorders of the eye. Is the patient up to date with their annual eye exam?  No  Who is the provider or what is the name of the office in which the patient attends annual eye exams? none  Dental Screening: Recommended annual dental exams for proper oral hygiene  Community Resource Referral / Chronic Care Management: CRR required this visit?  No   CCM required this visit?  No   Plan:    I have personally reviewed and noted the following in the patient's chart:   Medical and social history Use of alcohol, tobacco or illicit drugs  Current medications and supplements including opioid prescriptions. Patient is not currently taking opioid prescriptions. Functional ability and status Nutritional status Physical activity Advanced directives List of other physicians Hospitalizations, surgeries, and ER visits  in previous 12 months Vitals Screenings to include cognitive, depression, and falls Referrals and appointments  In addition, I have reviewed and discussed with patient certain preventive protocols, quality metrics, and best practice recommendations. A written personalized care plan for preventive services as well as general preventive health recommendations were provided to patient.   Ardella FORBES Dawn, LPN   [id number]   After Visit Summary: (MyChart) Due to this being a telephonic visit, the after visit summary with patients personalized plan was offered to patient via MyChart   Notes: Nothing significant to report at this time.

## 2023-12-26 NOTE — Patient Instructions (Signed)
 Luke Orr,  Thank you for taking the time for your Medicare Wellness Visit. I appreciate your continued commitment to your health goals. Please review the care plan we discussed, and feel free to reach out if I can assist you further.  Medicare recommends these wellness visits once per year to help you and your care team stay ahead of potential health issues. These visits are designed to focus on prevention, allowing your provider to concentrate on managing your acute and chronic conditions during your regular appointments.  Please note that Annual Wellness Visits do not include a physical exam. Some assessments may be limited, especially if the visit was conducted virtually. If needed, we may recommend a separate in-person follow-up with your provider.  Ongoing Care Seeing your primary care provider every 3 to 6 months helps us  monitor your health and provide consistent, personalized care.   Referrals If a referral was made during today's visit and you haven't received any updates within two weeks, please contact the referred provider directly to check on the status.  Recommended Screenings:  Health Maintenance  Topic Date Due   COVID-19 Vaccine (5 - 2025-26 season) 11/04/2023   Colon Cancer Screening  12/16/2023   Zoster (Shingles) Vaccine (1 of 2) 01/02/2024*   DTaP/Tdap/Td vaccine (2 - Td or Tdap) 09/14/2024   Pneumococcal Vaccine for age over 19 (3 of 3 - PCV20 or PCV21) 09/23/2024   Medicare Annual Wellness Visit  12/25/2024   Flu Shot  Completed   Hepatitis C Screening  Completed   Meningitis B Vaccine  Aged Out  *Topic was postponed. The date shown is not the original due date.       12/26/2023    2:53 PM  Advanced Directives  Does Patient Have a Medical Advance Directive? No  Would patient like information on creating a medical advance directive? No - Patient declined   Advance Care Planning is important because it: Ensures you receive medical care that aligns with  your values, goals, and preferences. Provides guidance to your family and loved ones, reducing the emotional burden of decision-making during critical moments.  Vision: Annual vision screenings are recommended for early detection of glaucoma, cataracts, and diabetic retinopathy. These exams can also reveal signs of chronic conditions such as diabetes and high blood pressure.  Dental: Annual dental screenings help detect early signs of oral cancer, gum disease, and other conditions linked to overall health, including heart disease and diabetes.  Please see the attached documents for additional preventive care recommendations.

## 2024-01-28 ENCOUNTER — Telehealth: Payer: Self-pay

## 2024-01-28 NOTE — Progress Notes (Signed)
   01/28/2024  Patient ID: Luke Orr, male   DOB: [birth year], 73 y.o.   MRN: [id number]  Pharmacy Quality Measure Review  This patient is appearing on a report for being at risk of failing the adherence measure for diabetes medications this calendar year.   Medication: Metformin  Last fill date: 09/13/23 for 90 day supply  Chart review shows dose was decreased to 1/2 tab daily end of Sept, with permission to stop med if difficulty splitting tab. Attempted to contact patient to determine if still taking. Unable to leave voicemail.  Jon VEAR Lindau, PharmD Clinical Pharmacist [phone]

## 2024-02-04 ENCOUNTER — Encounter: Payer: Self-pay | Admitting: Internal Medicine

## 2024-02-04 ENCOUNTER — Ambulatory Visit: Admitting: Internal Medicine

## 2024-02-04 VITALS — BP 108/67 | HR 58 | Temp 97.5°F | Resp 14 | Ht 67.5 in | Wt 212.0 lb

## 2024-02-04 DIAGNOSIS — K644 Residual hemorrhoidal skin tags: Secondary | ICD-10-CM | POA: Diagnosis not present

## 2024-02-04 DIAGNOSIS — Z1211 Encounter for screening for malignant neoplasm of colon: Secondary | ICD-10-CM | POA: Diagnosis not present

## 2024-02-04 DIAGNOSIS — K5730 Diverticulosis of large intestine without perforation or abscess without bleeding: Secondary | ICD-10-CM

## 2024-02-04 DIAGNOSIS — D124 Benign neoplasm of descending colon: Secondary | ICD-10-CM

## 2024-02-04 MED ORDER — SODIUM CHLORIDE 0.9 % IV SOLN: 500.0000 mL | Freq: Once | INTRAVENOUS | Status: DC

## 2024-02-04 NOTE — Op Note (Signed)
 [facility] Patient Name: Luke Orr Procedure Date: 02/04/2024 1:15 PM MRN: [id number] Endoscopist: Lupita FORBES Commander , MD, [id number] Age: 73 Referring MD:  Date of Birth: [birth year] Gender: Male Account #: [id number] Procedure:                Colonoscopy Indications:              Screening for colorectal malignant neoplasm, Last                            colonoscopy: 2015 Medicines:                Monitored Anesthesia Care Procedure:                Pre-Anesthesia Assessment:                           - Prior to the procedure, a History and Physical                            was performed, and patient medications and                            allergies were reviewed. The patient's tolerance of                            previous anesthesia was also reviewed. The risks                            and benefits of the procedure and the sedation                            options and risks were discussed with the patient.                            All questions were answered, and informed consent                            was obtained. Prior Anticoagulants: The patient has                            taken no anticoagulant or antiplatelet agents. ASA                            Grade Assessment: III - A patient with severe                            systemic disease. After reviewing the risks and                            benefits, the patient was deemed in satisfactory                            condition to undergo the procedure.  After obtaining informed consent, the colonoscope                            was passed under direct vision. Throughout the                            procedure, the patient's blood pressure, pulse, and                            oxygen saturations were monitored continuously. The                            Olympus Scope SN: [id number] was introduced through                            the anus and advanced to the the  cecum, identified                            by appendiceal orifice and ileocecal valve. The                            colonoscopy was performed without difficulty. The                            patient tolerated the procedure well. The quality                            of the bowel preparation was good. The ileocecal                            valve, appendiceal orifice, and rectum were                            photographed. The bowel preparation used was SUPREP                            via split dose instruction. Scope In: 1:51:28 PM Scope Out: 2:09:11 PM Scope Withdrawal Time: 0 hours 12 minutes 27 seconds  Total Procedure Duration: 0 hours 17 minutes 43 seconds  Findings:                 The perianal and digital rectal examinations were                            normal other than small external hemorrhoids.                            Pertinent negatives include normal prostate (size,                            shape, and consistency).                           A 5 mm polyp was found in the descending colon. The  polyp was sessile. The polyp was removed with a                            cold snare. Resection and retrieval were complete.                            Verification of patient identification for the                            specimen was done. Estimated blood loss was minimal.                           Multiple diverticula were found in the sigmoid                            colon, descending colon and transverse colon.                           The exam was otherwise without abnormality on                            direct and retroflexion views. Complications:            No immediate complications. Estimated Blood Loss:     Estimated blood loss was minimal. Impression:               - One 5 mm polyp in the descending colon, removed                            with a cold snare. Resected and retrieved.                           -  Diverticulosis in the sigmoid colon, in the                            descending colon and in the transverse colon.                           - Small external hemorrhoids                           - The examination was otherwise normal on direct                            and retroflexion views. Recommendation:           - Patient has a contact number available for                            emergencies. The signs and symptoms of potential                            delayed complications were discussed with the  patient. Return to normal activities tomorrow.                            Written discharge instructions were provided to the                            patient.                           - Resume previous diet.                           - Continue present medications.                           - Await pathology results.                           - No recommendation at this time regarding repeat                            colonoscopy due to age. Lupita FORBES Commander, MD 02/04/2024 2:18:50 PM This report has been signed electronically.

## 2024-02-04 NOTE — Progress Notes (Signed)
 Pt's states no medical or surgical changes since previsit or office visit.

## 2024-02-04 NOTE — Progress Notes (Signed)
 [facility] History and Physical   Primary Care Physician:  Bulah Alm RAMAN, PA-C   Reason for Procedure:    Encounter Diagnosis  Name Primary?   Special screening for malignant neoplasms, colon Yes     Plan:    colonoscopy   The patient was provided an opportunity to ask questions and all were answered. The patient agreed with the plan.   HPI: Luke Orr is a 73 y.o. male here for a repeat screening exam.   Past Medical History:  Diagnosis Date   Abnormal abdominal ultrasound 03/2018   no AAA, atherosclerosis present   Arthritis    right knee   BPH (benign prostatic hypertrophy)    with urinary urgency and weak stream, followed by Dr. Norleen Seltzer, [facility]   Bulging lumbar disc    self reported   DDD (degenerative disc disease), lumbar    Diabetes mellitus without complication (HCC)    Pre-diabetic   Elevated PSA    Dr. Seltzer, [facility]   Full dentures    H/O echocardiogram 03/2018   [facility], mod concetric LVH, EF 58%. mild left atrial enlargement   Hyperlipidemia    Hypertension    Impaired fasting blood sugar    Obesity    Obesity    OSA (obstructive sleep apnea) 10/28/2023   Sleep apnea     Past Surgical History:  Procedure Laterality Date   COLONOSCOPY  2015   mild diverticulosis, repeat 2025; Dr. Avram FONTANA HERNIA REPAIR Right 1995   PROSTATE BIOPSY  2014   x 2; Dr. Seltzer   PROSTATE BIOPSY     12/2023     Current Outpatient Medications  Medication Sig Dispense Refill   amLODipine  (NORVASC ) 10 MG tablet Take 1 tablet (10 mg total) by mouth daily. 90 tablet 1   ASPIRIN  LOW DOSE 81 MG tablet TAKE 1 TABLET BY MOUTH EVERY DAY 90 tablet 0   cholecalciferol (VITAMIN D3) 25 MCG (1000 UNIT) tablet Take 1,000 Units by mouth daily.     lisinopril -hydrochlorothiazide  (ZESTORETIC ) 20-12.5 MG tablet Take 1 tablet by mouth daily. 90 tablet 1   rosuvastatin  (CRESTOR ) 20 MG tablet TAKE 1 TABLET BY MOUTH  EVERY DAY 90 tablet 0   vitamin E 1000 UNIT capsule Take 1,000 Units by mouth daily.     cetirizine  (ZYRTEC ) 10 MG tablet Take 1 tablet (10 mg total) by mouth at bedtime. 30 tablet 11   metFORMIN  (GLUCOPHAGE ) 500 MG tablet Take 0.5 tablets (250 mg total) by mouth daily with breakfast. TAKE 1 TABLET BY MOUTH EVERY DAY WITH BREAKFAST (Patient not taking: No sig reported)     Current Facility-Administered Medications  Medication Dose Route Frequency Provider Last Rate Last Admin   0.9 %  sodium chloride  infusion  500 mL Intravenous Once Avram Lupita BRAVO, MD        Allergies as of 02/04/2024 - Review Complete 02/04/2024  Allergen Reaction Noted   Morphine and codeine Shortness Of Breath 08/20/2011    Family History  Problem Relation Age of Onset   Heart disease Mother    Diabetes Mother    Hypertension Mother    Heart disease Father    Hypertension Sister    Diabetes Sister    Hypertension Sister    Diabetes Sister    Hypertension Sister    Diabetes Sister    Hypertension Sister    Hypertension Sister    Aneurysm Sister        brain  Hypertension Brother    Cancer Brother 52       prostate   Heart disease Brother 72       CABG, died of CHF   Hypertension Brother    Diabetes Brother    Colon cancer Neg Hx    Stroke Neg Hx    Sleep apnea Neg Hx    Colon polyps Neg Hx    Esophageal cancer Neg Hx    Rectal cancer Neg Hx    Stomach cancer Neg Hx     Social History   Socioeconomic History   Marital status: Married    Spouse name: Not on file   Number of children: 3   Years of education: Not on file   Highest education level: Not on file  Occupational History   Not on file  Tobacco Use   Smoking status: Former    Current packs/day: 0.00    Average packs/day: 1 pack/day for 5.0 years (5.0 ttl pk-yrs)    Types: Cigarettes    Start date: 03/05/1998    Quit date: 03/06/2003    Years since quitting: 20.9   Smokeless tobacco: Never  Vaping Use   Vaping status: Never Used   Substance and Sexual Activity   Alcohol use: Yes    Alcohol/week: 7.0 standard drinks of alcohol    Types: 7 Glasses of wine per week    Comment: red wine every night   Drug use: No   Sexual activity: Not on file  Other Topics Concern   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  11/2022   Social Drivers of Health   Financial Resource Strain: Low Risk  (12/26/2023)   Overall Financial Resource Strain (CARDIA)    Difficulty of Paying Living Expenses: Not hard at all  Food Insecurity: No Food Insecurity (12/26/2023)   Hunger Vital Sign    Worried About Running Out of Food in the Last Year: Never true    Ran Out of Food in the Last Year: Never true  Transportation Needs: No Transportation Needs (12/26/2023)   PRAPARE - Administrator, Civil Service (Medical): No    Lack of Transportation (Non-Medical): No  Physical Activity: Inactive (12/26/2023)   Exercise Vital Sign    Days of Exercise per Week: 0 days    Minutes of Exercise per Session: 0 min  Stress: No Stress Concern Present (12/26/2023)   [employer] of Occupational Health - Occupational Stress Questionnaire    Feeling of Stress: Not at all  Social Connections: Moderately Isolated (12/26/2023)   Social Connection and Isolation Panel    Frequency of Communication with Friends and Family: More than three times a week    Frequency of Social Gatherings with Friends and Family: More than three times a week    Attends Religious Services: Never    Database Administrator or Organizations: No    Attends Banker Meetings: Never    Marital Status: Married  Catering Manager Violence: Not At Risk (12/26/2023)   Humiliation, Afraid, Rape, and Kick questionnaire    Fear of Current or Ex-Partner: No    Emotionally Abused: No    Physically Abused: No    Sexually Abused: No    Review of Systems:  All other  review of systems negative except as mentioned in the HPI.  Physical Exam: Vital signs BP 122/71  Pulse 70   Temp (!) 97.5 F (36.4 C)   Ht 5' 7.5 (1.715 m)   Wt 212 lb (96.2 kg)   SpO2 98%   BMI 32.71 kg/m   General:   Alert,  Well-developed, well-nourished, pleasant and cooperative in NAD Lungs:  Clear throughout to auscultation.   Heart:  Regular rate and rhythm; no murmurs, clicks, rubs,  or gallops. Abdomen:  Soft, nontender and nondistended. Normal bowel sounds.   Neuro/Psych:  Alert and cooperative. Normal mood and affect. A and O x 3   @Alija Riano  CHARLENA Commander, MD, [facility] [phone] (pager) 02/04/2024 1:44 PM@

## 2024-02-04 NOTE — Patient Instructions (Addendum)
 I found the removed 1 tiny polyp.  It looks benign.  I will have it analyzed and let you know when or if to repeat a colonoscopy.  You also have a condition called diverticulosis - common and not usually a problem. Please read the handout provided.  I appreciate the opportunity to care for you. Lupita CHARLENA Commander, MD, [facility]   Handouts Provided:  Polyps and Diverticulosis  YOU HAD AN ENDOSCOPIC PROCEDURE TODAY AT THE [facility]:   Refer to the procedure report that was given to you for any specific questions about what was found during the examination.  If the procedure report does not answer your questions, please call your gastroenterologist to clarify.  If you requested that your care partner not be given the details of your procedure findings, then the procedure report has been included in a sealed envelope for you to review at your convenience later.  YOU SHOULD EXPECT: Some feelings of bloating in the abdomen. Passage of more gas than usual.  Walking can help get rid of the air that was put into your GI tract during the procedure and reduce the bloating. If you had a lower endoscopy (such as a colonoscopy or flexible sigmoidoscopy) you may notice spotting of blood in your stool or on the toilet paper. If you underwent a bowel prep for your procedure, you may not have a normal bowel movement for a few days.  Please Note:  You might notice some irritation and congestion in your nose or some drainage.  This is from the oxygen used during your procedure.  There is no need for concern and it should clear up in a day or so.  SYMPTOMS TO REPORT IMMEDIATELY:  Following lower endoscopy (colonoscopy or flexible sigmoidoscopy):  Excessive amounts of blood in the stool  Significant tenderness or worsening of abdominal pains  Swelling of the abdomen that is new, acute  Fever of 100F or higher  For urgent or emergent issues, a gastroenterologist can be reached at any hour by calling [phone]  [phone]. Do not use MyChart messaging for urgent concerns.    DIET:  We do recommend a small meal at first, but then you may proceed to your regular diet.  Drink plenty of fluids but you should avoid alcoholic beverages for 24 hours.  ACTIVITY:  You should plan to take it easy for the rest of today and you should NOT DRIVE or use heavy machinery until tomorrow (because of the sedation medicines used during the test).    FOLLOW UP: Our staff will call the number listed on your records the next business day following your procedure.  We will call around 7:15- 8:00 am to check on you and address any questions or concerns that you may have regarding the information given to you following your procedure. If we do not reach you, we will leave a message.     If any biopsies were taken you will be contacted by phone or by letter within the next 1-3 weeks.  Please call us  at [phone] if you have not heard about the biopsies in 3 weeks.    SIGNATURES/CONFIDENTIALITY: You and/or your care partner have signed paperwork which will be entered into your electronic medical record.  These signatures attest to the fact that that the information above on your After Visit Summary has been reviewed and is understood.  Full responsibility of the confidentiality of this discharge information lies with you and/or your care-partner.

## 2024-02-04 NOTE — Progress Notes (Signed)
 Sedate, gd SR, tolerated procedure well, VSS, report to RN

## 2024-02-04 NOTE — Progress Notes (Signed)
 Called to room to assist during endoscopic procedure.  Patient ID and intended procedure confirmed with present staff. Received instructions for my participation in the procedure from the performing physician.

## 2024-02-05 ENCOUNTER — Telehealth: Payer: Self-pay

## 2024-02-05 NOTE — Telephone Encounter (Signed)
 Left message on follow up call.

## 2024-02-05 NOTE — Progress Notes (Signed)
   02/05/2024  Patient ID: Luke Orr, male   DOB: May 08, 1950, 73 y.o.   MRN: [id number]  Pharmacy Quality Measure Review  This patient is appearing on a report for being at risk of failing the adherence measure for diabetes medications this calendar year.   Medication: Metformin  Last fill date: 09/13/23 for 90 day supply  Chart review shows dose was decreased to 1/2 tab daily end of Sept, with permission to stop med if difficulty splitting tab. Attempted to contact patient to determine if still taking. Unable to leave voicemail.  Jon VEAR Lindau, PharmD Clinical Pharmacist [phone]

## 2024-02-07 LAB — SURGICAL PATHOLOGY

## 2024-02-10 ENCOUNTER — Ambulatory Visit: Payer: Self-pay | Admitting: Internal Medicine

## 2024-02-12 ENCOUNTER — Other Ambulatory Visit: Payer: Self-pay | Admitting: Medical

## 2024-04-05 ENCOUNTER — Other Ambulatory Visit: Payer: Self-pay | Admitting: Medical

## 2024-04-05 DIAGNOSIS — I10 Essential (primary) hypertension: Secondary | ICD-10-CM

## 2024-11-24 ENCOUNTER — Encounter: Payer: Self-pay | Admitting: Medical

## 2024-12-29 ENCOUNTER — Ambulatory Visit: Payer: Self-pay
